# Patient Record
Sex: Female | Born: 1995 | Race: Black or African American | Hispanic: No | Marital: Single | Smoking: Former smoker
Health system: Southern US, Community
[De-identification: ages and names within clinical notes are randomized; demographics above are authoritative.]

## PROBLEM LIST (undated history)

## (undated) DIAGNOSIS — D573 Sickle-cell trait: Secondary | ICD-10-CM

## (undated) DIAGNOSIS — F32A Depression, unspecified: Secondary | ICD-10-CM

## (undated) DIAGNOSIS — B009 Herpesviral infection, unspecified: Secondary | ICD-10-CM

## (undated) DIAGNOSIS — F419 Anxiety disorder, unspecified: Secondary | ICD-10-CM

## (undated) DIAGNOSIS — F99 Mental disorder, not otherwise specified: Secondary | ICD-10-CM

## (undated) DIAGNOSIS — F319 Bipolar disorder, unspecified: Secondary | ICD-10-CM

## (undated) HISTORY — DX: Mental disorder, not otherwise specified: F99

## (undated) HISTORY — DX: Herpesviral infection, unspecified: B00.9

## (undated) HISTORY — PX: CHOLECYSTECTOMY: SHX55

## (undated) HISTORY — PX: TONSILLECTOMY AND ADENOIDECTOMY: SUR1326

---
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
## 2017-07-27 ENCOUNTER — Other Ambulatory Visit (HOSPITAL_COMMUNITY)
Admission: RE | Admit: 2017-07-27 | Discharge: 2017-07-27 | Disposition: A | Discharge status: Home / Self Care | Payer: Self-pay | Source: Ambulatory Visit | Attending: Advanced Practice Midwife | Admitting: Advanced Practice Midwife

## 2017-07-27 ENCOUNTER — Other Ambulatory Visit: Payer: Self-pay

## 2017-07-27 ENCOUNTER — Ambulatory Visit (INDEPENDENT_AMBULATORY_CARE_PROVIDER_SITE_OTHER): Payer: 59 | Admitting: Advanced Practice Midwife

## 2017-07-27 ENCOUNTER — Encounter: Payer: Self-pay | Admitting: Advanced Practice Midwife

## 2017-07-27 ENCOUNTER — Encounter (INDEPENDENT_AMBULATORY_CARE_PROVIDER_SITE_OTHER): Payer: Self-pay

## 2017-07-27 VITALS — BP 107/57 | HR 74 | Ht 61.0 in | Wt 214.0 lb

## 2017-07-27 DIAGNOSIS — Z01419 Encounter for gynecological examination (general) (routine) without abnormal findings: Secondary | ICD-10-CM

## 2017-07-27 DIAGNOSIS — Z01411 Encounter for gynecological examination (general) (routine) with abnormal findings: Secondary | ICD-10-CM | POA: Diagnosis not present

## 2017-07-27 DIAGNOSIS — B009 Herpesviral infection, unspecified: Secondary | ICD-10-CM | POA: Diagnosis not present

## 2017-07-27 HISTORY — DX: Herpesviral infection, unspecified: B00.9

## 2017-07-27 NOTE — Progress Notes (Signed)
Michelle Stephenson 22 y.o.  Vitals:   07/27/17 1112  BP: (!) 107/57  Pulse: 74     Filed Weights   07/27/17 1112  Weight: 214 lb (97.1 kg)    Past Medical History: Past Medical History:  Diagnosis Date  . Mental disorder    depression; PTSD    Past Surgical History: Past Surgical History:  Procedure Laterality Date  . CHOLECYSTECTOMY    . TONSILLECTOMY AND ADENOIDECTOMY      Family History: Family History  Problem Relation Age of Onset  . Diabetes Maternal Grandmother   . Hypertension Maternal Grandmother     Social History: Social History   Tobacco Use  . Smoking status: Current Every Day Smoker    Packs/day: 0.25    Types: Cigarettes  . Smokeless tobacco: Never Used  Substance Use Topics  . Alcohol use: Not Currently  . Drug use: Yes    Types: Marijuana    Allergies:  Allergies  Allergen Reactions  . Banana Swelling  . Onion Swelling  . Strawberry (Diagnostic) Swelling     No current outpatient medications on file.  History of Present Illness: Here for pap  Says had pap 11/18 and had "precancerous cells"  Did not f/u d/t "being terrified".  Discussed reasons/need for f/u and pt agrees to f/u w/todya's results.  Dx w/HSV last year based on clinical appearance.  Has had recurring lesion q mo nth w/period.  Also gets "hair bumps".  Not very happy w/current relationship, [facility], worried she can't get pregnant, but admits she doesn't want to get pregnant w/this man's baby.  Wants to get a NExplanon.  Had informal US by family member, said saw a simple ovarian cyst on left side. No pain.   Review of Systems   Patient denies any headaches, blurred vision, shortness of breath, chest pain, abdominal pain, problems with bowel movements, urination   Physical Exam: General:  Well developed, well nourished, no acute distress Skin:  Warm and dry Neck:  Midline trachea, normal thyroid Lungs; Clear to auscultation bilaterally Breast:  No dominant palpable  mass, retraction, or nipple discharge Cardiovascular: Regular rate and rhythm Abdomen:  Soft, non tender, no hepatosplenomegaly Pelvic:  External genitalia w/drying HSV appearing lesion on left side of mons.  Right vulva has 2 furuncles that dont' appear to be HSV.  None right for culture. The vagina is normal in appearance.  On period (started Sunday). The cervix is nulliparous.  Uterus is felt to be normal size, shape, and contour.  No adnexal masses or tenderness noted.  Extremities:  No swelling or varicosities noted Psych:  No mood changes.     Impression: normal GYN exam HSV Contraception counsleing     Plan: F/U pap per results  Offered valtrex suppression--says had worse outbreaks when taking, so doesn't want to take unless gets new partner as outbreaks don't really bother her  Plan Nexplanon ASAP; on period now, no sex until gets it  FU US in August for ov cyst

## 2017-07-29 ENCOUNTER — Encounter: Payer: 59 | Admitting: Adult Health

## 2017-07-29 LAB — CYTOLOGY - PAP
CHLAMYDIA, DNA PROBE: NEGATIVE
DIAGNOSIS: NEGATIVE
NEISSERIA GONORRHEA: NEGATIVE

## 2017-08-11 ENCOUNTER — Ambulatory Visit (INDEPENDENT_AMBULATORY_CARE_PROVIDER_SITE_OTHER): Payer: 59 | Admitting: Advanced Practice Midwife

## 2017-08-11 ENCOUNTER — Encounter: Payer: Self-pay | Admitting: Advanced Practice Midwife

## 2017-08-11 VITALS — BP 108/73 | HR 88 | Ht 61.0 in | Wt 218.0 lb

## 2017-08-11 DIAGNOSIS — Z3202 Encounter for pregnancy test, result negative: Secondary | ICD-10-CM | POA: Diagnosis not present

## 2017-08-11 DIAGNOSIS — Z3046 Encounter for surveillance of implantable subdermal contraceptive: Secondary | ICD-10-CM | POA: Diagnosis not present

## 2017-08-11 DIAGNOSIS — Z30017 Encounter for initial prescription of implantable subdermal contraceptive: Secondary | ICD-10-CM | POA: Insufficient documentation

## 2017-08-11 LAB — POCT URINE PREGNANCY: Preg Test, Ur: NEGATIVE

## 2017-08-11 MED ORDER — ETONOGESTREL 68 MG ~~LOC~~ IMPL
68.0000 mg | DRUG_IMPLANT | Freq: Once | SUBCUTANEOUS | Status: AC
  Administered 2017-08-11: 68 mg via SUBCUTANEOUS

## 2017-08-11 NOTE — Addendum Note (Signed)
Addended by: Colen DarlingYOUNG, Melven Stockard S on: 08/11/2017 11:53 AM   Modules accepted: Orders

## 2017-08-11 NOTE — Progress Notes (Signed)
  HPI:  Michelle Stephenson is a 22 y.o. year old African American female here for Nexplanon insertion.  Her LMP was 3 weeks ago, no sex since then , and her pregnancy test today was negative.  Risks/benefits/side effects of Nexplanon have been discussed and her questions have been answered.  Specifically, a failure rate of 02/998 has been reported, with an increased failure rate if pt takes St. John's Wort and/or antiseizure medicaitons.  Michelle Stephenson is aware of the common side effect of irregular bleeding, which the incidence of decreases over time.   Past Medical History: Past Medical History:  Diagnosis Date  . 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 Mental disorder    depression; PTSD    Past Surgical History: Past Surgical History:  Procedure Laterality Date  . CHOLECYSTECTOMY    . TONSILLECTOMY AND ADENOIDECTOMY      Family History: Family History  Problem Relation Age of Onset  . Diabetes Maternal Grandmother   . Hypertension Maternal Grandmother     Social History: Social History   Tobacco Use  . Smoking status: Current Every Day Smoker    Packs/day: 0.25    Years: 1.00    Pack years: 0.25    Types: Cigarettes  . Smokeless tobacco: Never Used  Substance Use Topics  . Alcohol use: Not Currently  . Drug use: Yes    Types: Marijuana    Comment: twice a month    Allergies:  Allergies  Allergen Reactions  . Banana Swelling  . Onion Swelling  . Strawberry (Diagnostic) Swelling      Her left arm, approximatly 4 inches proximal from the elbow, was cleansed with alcohol and anesthetized with 2cc of 2% Lidocaine.  The area was cleansed again and the Nexplanon was inserted without difficulty.  A pressure bandage was applied.  Pt was instructed to remove pressure bandage in a few hours, and keep insertion site covered with a bandaid for 3 days.  Back up contraception was recommended for 2 weeks.  Follow-up scheduled PRN problems  Jacklyn ShellFrances  Cresenzo-Dishmon 08/11/2017 11:21 AM

## 2017-11-29 ENCOUNTER — Encounter: Payer: 59 | Admitting: Women's Health

## 2017-12-13 ENCOUNTER — Encounter: Payer: 59 | Admitting: Women's Health

## 2017-12-20 ENCOUNTER — Encounter: Payer: Self-pay | Admitting: Women's Health

## 2017-12-20 ENCOUNTER — Ambulatory Visit (INDEPENDENT_AMBULATORY_CARE_PROVIDER_SITE_OTHER): Payer: 59 | Admitting: Women's Health

## 2017-12-20 VITALS — BP 121/79 | HR 103 | Ht 61.0 in | Wt 225.0 lb

## 2017-12-20 DIAGNOSIS — Z3049 Encounter for surveillance of other contraceptives: Secondary | ICD-10-CM | POA: Diagnosis not present

## 2017-12-20 DIAGNOSIS — Z3046 Encounter for surveillance of implantable subdermal contraceptive: Secondary | ICD-10-CM

## 2017-12-20 NOTE — Patient Instructions (Signed)
Keep the area clean and dry.  You can remove the big bandage in 24 hours, and the small steri-strip bandage in 3-5 days.  
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
## 2017-12-30 ENCOUNTER — Ambulatory Visit: Payer: 59 | Admitting: Women's Health

## 2018-02-09 ENCOUNTER — Ambulatory Visit: Payer: 59 | Admitting: Obstetrics and Gynecology

## 2018-03-23 DIAGNOSIS — Y999 Unspecified external cause status: Secondary | ICD-10-CM | POA: Insufficient documentation

## 2018-03-23 DIAGNOSIS — S6992XA Unspecified injury of left wrist, hand and finger(s), initial encounter: Secondary | ICD-10-CM | POA: Diagnosis present

## 2018-03-23 DIAGNOSIS — F17210 Nicotine dependence, cigarettes, uncomplicated: Secondary | ICD-10-CM | POA: Insufficient documentation

## 2018-03-23 DIAGNOSIS — S01511A Laceration without foreign body of lip, initial encounter: Secondary | ICD-10-CM | POA: Diagnosis not present

## 2018-03-23 DIAGNOSIS — S032XXA Dislocation of tooth, initial encounter: Secondary | ICD-10-CM | POA: Insufficient documentation

## 2018-03-23 DIAGNOSIS — S60222A Contusion of left hand, initial encounter: Secondary | ICD-10-CM | POA: Diagnosis not present

## 2018-03-23 DIAGNOSIS — Y9389 Activity, other specified: Secondary | ICD-10-CM | POA: Insufficient documentation

## 2018-03-23 DIAGNOSIS — Y929 Unspecified place or not applicable: Secondary | ICD-10-CM | POA: Diagnosis not present

## 2018-03-24 ENCOUNTER — Emergency Department (HOSPITAL_COMMUNITY)
Admission: EM | Admit: 2018-03-24 | Discharge: 2018-03-24 | Disposition: A | Discharge status: Home / Self Care | Payer: 59 | Attending: Emergency Medicine | Admitting: Emergency Medicine

## 2018-03-24 ENCOUNTER — Other Ambulatory Visit: Payer: Self-pay

## 2018-03-24 ENCOUNTER — Emergency Department (HOSPITAL_COMMUNITY): Payer: 59

## 2018-03-24 ENCOUNTER — Encounter (HOSPITAL_COMMUNITY): Payer: Self-pay | Admitting: Emergency Medicine

## 2018-03-24 DIAGNOSIS — S60222A Contusion of left hand, initial encounter: Secondary | ICD-10-CM

## 2018-03-24 DIAGNOSIS — S0993XA Unspecified injury of face, initial encounter: Secondary | ICD-10-CM

## 2018-03-24 DIAGNOSIS — S01511A Laceration without foreign body of lip, initial encounter: Secondary | ICD-10-CM

## 2018-03-24 MED ORDER — HYDROCODONE-ACETAMINOPHEN 5-325 MG PO TABS
2.0000 | ORAL_TABLET | Freq: Once | ORAL | Status: AC
  Administered 2018-03-24: 2 via ORAL
  Filled 2018-03-24: qty 2

## 2018-03-24 MED ORDER — HYDROCODONE-ACETAMINOPHEN 5-325 MG PO TABS: 1.0000 | ORAL_TABLET | Freq: Four times a day (QID) | ORAL | 0 refills | Status: DC | PRN

## 2018-03-24 NOTE — Discharge Instructions (Addendum)
Hydrocodone as prescribed as needed for pain.  Saline rinses of your lip every several hours for the next 2 days.  Your hand for 20 minutes every 2 hours while awake for the next 2 days.  Follow-up with dentistry.  The resource guide for local dentist has been provided to you so that you can call and make these arrangements.  I would recommend calling tomorrow morning and arrange an appointment for sometime tomorrow.
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
## 2018-03-24 NOTE — ED Notes (Signed)
Patient transported to X-ray 
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
## 2018-07-27 ENCOUNTER — Other Ambulatory Visit: Payer: Self-pay

## 2018-07-27 ENCOUNTER — Encounter: Payer: Self-pay | Admitting: Adult Health

## 2018-07-27 ENCOUNTER — Ambulatory Visit (INDEPENDENT_AMBULATORY_CARE_PROVIDER_SITE_OTHER): Payer: 59 | Admitting: Adult Health

## 2018-07-27 VITALS — Ht 61.0 in | Wt 210.0 lb

## 2018-07-27 DIAGNOSIS — Z3A01 Less than 8 weeks gestation of pregnancy: Secondary | ICD-10-CM | POA: Insufficient documentation

## 2018-07-27 DIAGNOSIS — Z3201 Encounter for pregnancy test, result positive: Secondary | ICD-10-CM | POA: Insufficient documentation

## 2018-07-27 DIAGNOSIS — O3680X0 Pregnancy with inconclusive fetal viability, not applicable or unspecified: Secondary | ICD-10-CM | POA: Diagnosis not present

## 2018-07-27 NOTE — Progress Notes (Signed)
Patient ID: Michelle Stephenson, female   DOB: Feb 16, 1996, 23 y.o.   MRN: [id number]   TELEHEALTH VIRTUAL GYNECOLOGY VISIT ENCOUNTER NOTE  I connected with Michelle Stephenson on 07/27/18 at  2:45 PM EDT by telephone at home and verified that I am speaking with the correct person using two identifiers.   I discussed the limitations, risks, security and privacy concerns of performing an evaluation and management service by telephone and the availability of in person appointments. I also discussed with the patient that there may be a patient responsible charge related to this service. The patient expressed understanding and agreed to proceed.   History:  Michelle Stephenson is a 23 y.o. G24P0020 female being evaluated today for missed period and +HPT, about 5 weeks by LMP 06/22/18, EDD 03/29/19.She has breat tenderness, some cramping and decrease appetite and constipation. She did detox cleanse about 3 weeks ago. She denies any abnormal vaginal discharge, bleeding,  or other concerns.       Past Medical History:  Diagnosis Date  . 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 Mental disorder    depression; PTSD   Past Surgical History:  Procedure Laterality Date  . CHOLECYSTECTOMY    . TONSILLECTOMY AND ADENOIDECTOMY     The following portions of the patient's history were reviewed and updated as appropriate: allergies, current medications, past family history, past medical history, past social history, past surgical history and problem list.   Health Maintenance:  Normal pap on 07/27/17.  Review of Systems:  Pertinent items noted in HPI and remainder of comprehensive ROS otherwise negative.  Physical Exam:   General:  Alert, oriented and cooperative.   Mental Status: Normal mood and affect perceived. Normal judgment and thought content.  Physical exam deferred due to nature of the encounter Ht 5\' 1"  (1.549 m)   Wt 210 lb (95.3 kg)   LMP 06/22/2018   BMI 39.68 kg/m per pt. Fall risk  is low.  Labs and Imaging No results found for this or any previous visit (from the past 336 hour(s)). No results found.    Assessment and Plan:     1. Positive pregnancy test +HPT  2. Less than [redacted] weeks gestation of pregnancy -continue PNV -eat often -try senokot for constipation -decrease smoking   3. Encounter to determine fetal viability of pregnancy, single or unspecified fetus -will get dating [place] in 3 weeks  - US OB Comp Less 14 Wks; Future       I discussed the assessment and treatment plan with the patient. The patient was provided an opportunity to ask questions and all were answered. The patient agreed with the plan and demonstrated an understanding of the instructions.   The patient was advised to call back or seek an in-person evaluation/go to the ED if the symptoms worsen or if the condition fails to improve as anticipated.  I provided 8 minutes of non-face-to-face time during this encounter.   Cyril Mourning, NP [facility] [employer], [facility]

## 2018-08-04 ENCOUNTER — Telehealth: Payer: Self-pay | Admitting: Women's Health

## 2018-08-04 ENCOUNTER — Other Ambulatory Visit: Payer: 59

## 2018-08-04 NOTE — Telephone Encounter (Signed)
Patient states she noticed some light pink spotting last night that went away but today she has had more.  She is having some cramping as well and has noticed the bleeding is getting brighter in color.  When asked if she has had intercourse recently, pt replied "not really, just a little penetration, just oral". I then asked if she has had anything in her vagina recently and patient stated "just a finger".  Informed patient that some spotting can occur during pregnancy for various reasons one of which is during or after intercourse. Since she was not having very heavy bleeding or passing clots, she could continue to monitor however, since she does not know her blood type, then we could draw labs to check her HCG, progesterone and ABO.  Pt concerned as to why progesterone has not been checked prior to this time and I explained to her that sometimes, that is not always done.  Patient states she was planning on going to the ER.  I told her she could go if she felt like she needed to, however, if she were experiencing a miscarriage, there is not anything that could be done at this time to prevent this from occurring.  Pt verbalized understanding and will come to have labs done.

## 2018-08-04 NOTE — Telephone Encounter (Signed)
Patient called, stated she has an ultrasound appointment on 6/24, but she would like to see someone before then (not change her ultrasound appt), due to the bleeding.  Please advise.  [phone]

## 2018-08-05 LAB — ABO/RH: Rh Factor: POSITIVE

## 2018-08-05 LAB — PROGESTERONE: Progesterone: 0.5 ng/mL

## 2018-08-05 LAB — BETA HCG QUANT (REF LAB): hCG Quant: 77 m[IU]/mL

## 2018-08-08 ENCOUNTER — Other Ambulatory Visit: Payer: Self-pay

## 2018-08-08 ENCOUNTER — Other Ambulatory Visit: Payer: 59

## 2018-08-08 ENCOUNTER — Telehealth: Payer: Self-pay | Admitting: *Deleted

## 2018-08-08 ENCOUNTER — Encounter: Payer: Self-pay | Admitting: *Deleted

## 2018-08-08 DIAGNOSIS — O200 Threatened abortion: Secondary | ICD-10-CM

## 2018-08-08 NOTE — Telephone Encounter (Signed)
mychart message sent requesting patient come in for labwork today.

## 2018-08-09 ENCOUNTER — Other Ambulatory Visit: Payer: Self-pay | Admitting: Adult Health

## 2018-08-09 DIAGNOSIS — O3680X0 Pregnancy with inconclusive fetal viability, not applicable or unspecified: Secondary | ICD-10-CM

## 2018-08-09 DIAGNOSIS — O039 Complete or unspecified spontaneous abortion without complication: Secondary | ICD-10-CM

## 2018-08-09 LAB — BETA HCG QUANT (REF LAB): hCG Quant: 5 m[IU]/mL

## 2018-08-09 NOTE — Progress Notes (Signed)
Check QHCG in 1 week

## 2018-08-16 ENCOUNTER — Encounter: Payer: Self-pay | Admitting: *Deleted

## 2018-08-17 ENCOUNTER — Other Ambulatory Visit: Payer: 59

## 2018-08-18 LAB — BETA HCG QUANT (REF LAB): hCG Quant: <1 m[IU]/mL

## 2018-11-07 ENCOUNTER — Encounter: Payer: Self-pay | Admitting: *Deleted

## 2018-11-08 ENCOUNTER — Ambulatory Visit: Payer: 59 | Admitting: Internal Medicine

## 2019-02-24 NOTE — L&D Delivery Note (Signed)
Delivery Note At 3:56 PM a viable and healthy female was delivered via Vaginal, Spontaneous (Presentation: Left Occiput Anterior).  APGAR: 9, 9; weight  pending.   Placenta status: Spontaneous, Intact.  Cord: 3 vessels with the following complications: None.  Cord pH: na  Anesthesia: Epidural Episiotomy: None Lacerations: 1st degree;Periurethral Suture Repair: 2.0 vicryl rapide Est. Blood Loss (mL): 300  Mom to postpartum.  Baby to Couplet care / Skin to Skin.  Alona Danford J 12/25/2019, 4:10 PM

## 2019-05-22 LAB — OB RESULTS CONSOLE HEPATITIS B SURFACE ANTIGEN: Hepatitis B Surface Ag: NEGATIVE

## 2019-05-22 LAB — OB RESULTS CONSOLE ABO/RH: RH Type: POSITIVE

## 2019-05-22 LAB — OB RESULTS CONSOLE HIV ANTIBODY (ROUTINE TESTING): HIV: NONREACTIVE

## 2019-05-22 LAB — OB RESULTS CONSOLE RUBELLA ANTIBODY, IGM: Rubella: IMMUNE

## 2019-05-22 LAB — OB RESULTS CONSOLE RPR: RPR: NONREACTIVE

## 2019-05-22 LAB — OB RESULTS CONSOLE ANTIBODY SCREEN: Antibody Screen: NEGATIVE

## 2019-06-06 ENCOUNTER — Ambulatory Visit (HOSPITAL_COMMUNITY): Payer: 59 | Attending: Obstetrics and Gynecology

## 2019-06-06 ENCOUNTER — Encounter (HOSPITAL_COMMUNITY): Payer: Self-pay
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
## 2019-11-26 ENCOUNTER — Other Ambulatory Visit: Payer: Self-pay

## 2019-11-26 ENCOUNTER — Inpatient Hospital Stay (HOSPITAL_COMMUNITY)
Admission: AD | Admit: 2019-11-26 | Discharge: 2019-11-27 | Disposition: A | Discharge status: Home / Self Care | Payer: 59 | Attending: Obstetrics & Gynecology | Admitting: Obstetrics & Gynecology

## 2019-11-26 ENCOUNTER — Encounter (HOSPITAL_COMMUNITY): Payer: Self-pay | Admitting: Obstetrics & Gynecology

## 2019-11-26 DIAGNOSIS — D573 Sickle-cell trait: Secondary | ICD-10-CM | POA: Insufficient documentation

## 2019-11-26 DIAGNOSIS — O99333 Smoking (tobacco) complicating pregnancy, third trimester: Secondary | ICD-10-CM | POA: Insufficient documentation

## 2019-11-26 DIAGNOSIS — Z3A36 36 weeks gestation of pregnancy: Secondary | ICD-10-CM

## 2019-11-26 DIAGNOSIS — O4700 False labor before 37 completed weeks of gestation, unspecified trimester: Secondary | ICD-10-CM

## 2019-11-26 DIAGNOSIS — O4703 False labor before 37 completed weeks of gestation, third trimester: Secondary | ICD-10-CM

## 2019-11-26 DIAGNOSIS — F17210 Nicotine dependence, cigarettes, uncomplicated: Secondary | ICD-10-CM | POA: Insufficient documentation

## 2019-11-26 DIAGNOSIS — O99013 Anemia complicating pregnancy, third trimester: Secondary | ICD-10-CM | POA: Insufficient documentation

## 2019-11-26 HISTORY — DX: Sickle-cell trait: D57.3

## 2019-11-26 LAB — URINALYSIS, ROUTINE W REFLEX MICROSCOPIC
Bilirubin Urine: NEGATIVE
Glucose, UA: NEGATIVE mg/dL
Hgb urine dipstick: NEGATIVE
Ketones, ur: NEGATIVE mg/dL
Leukocytes,Ua: NEGATIVE
Nitrite: NEGATIVE
Protein, ur: NEGATIVE mg/dL
Specific Gravity, Urine: 1.009 (ref 1.005–1.030)
pH: 5 (ref 5.0–8.0)

## 2019-11-26 NOTE — MAU Note (Signed)
Pt c/o of constant sharp back pain and intermittent  abdominal pressure for about 1.5 hours. Denies LOF or VB. +FM

## 2019-11-26 NOTE — MAU Provider Note (Signed)
History     CSN: [id number]  Arrival date and time: 11/26/19 2241   First Provider Initiated Contact with Patient 11/26/19 2325      Chief Complaint  Patient presents with   Contractions   Back Pain   HPI Michelle Stephenson is a 24 y.o. G4P0030 at [redacted]w[redacted]d who presents with contractions. She states they started at 2200 and are every 5 minutes. She denies any leaking or bleeding. Reports normal fetal movement. She is unsure what labor feels like and wanted to be checked. She drank 2 bottles of water today total.   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   left mons   Mental disorder    depression; PTSD   Sickle cell trait (HCC)     Past Surgical History:  Procedure Laterality Date   CHOLECYSTECTOMY     TONSILLECTOMY AND ADENOIDECTOMY      Family History  Problem Relation Age of Onset   Diabetes Maternal Grandmother    Hypertension Maternal Grandmother     Social History   Tobacco Use   Smoking status: Current Every Day Smoker    Packs/day: 0.25    Years: 1.00    Pack years: 0.25    Types: Cigarettes   Smokeless tobacco: Never Used  Vaping Use   Vaping Use: Never used  Substance Use Topics   Alcohol use: Not Currently   Drug use: Not Currently    Types: Marijuana    Comment: twice a month    Allergies:  Allergies  Allergen Reactions   Banana Swelling   Onion Swelling   Strawberry (Diagnostic) Swelling    Medications Prior to Admission  Medication Sig Dispense Refill Last Dose   prenatal vitamin w/FE, FA (NATACHEW) 29-1 MG CHEW chewable tablet Chew 1 tablet by mouth daily at 12 noon.   11/25/2019 at Unknown time   HYDROcodone-acetaminophen (NORCO) 5-325 MG tablet Take 1-2 tablets by mouth every 6 (six) hours as  needed. (Patient not taking: Reported on 07/27/2018) 12 tablet 0     Review of Systems  Constitutional: Negative.  Negative for fatigue and fever.  HENT: Negative.   Respiratory: Negative.  Negative for shortness of breath.   Cardiovascular: Negative.  Negative for chest pain.  Gastrointestinal: Positive for abdominal pain. Negative for constipation, diarrhea, nausea and vomiting.  Genitourinary: Negative.  Negative for dysuria, vaginal bleeding and vaginal discharge.  Neurological: Negative.  Negative for dizziness and headaches.   Physical Exam   Blood pressure 110/66, pulse 86, temperature 98 F (36.7 C), temperature source Oral, resp. rate 18, height 5\' 1"  (1.549 m), weight 102.5 kg, SpO2 100 %, unknown if currently breastfeeding.  Physical Exam Vitals and nursing note reviewed.  Constitutional:      General: She is not in acute distress.    Appearance: She is well-developed.  HENT:     Head: Normocephalic.  Eyes:     Pupils: Pupils are equal, round, and reactive to light.  Cardiovascular:     Rate  and Rhythm: Normal rate and regular rhythm.     Heart sounds: Normal heart sounds.  Pulmonary:     Effort: Pulmonary effort is normal. No respiratory distress.     Breath sounds: Normal breath sounds.  Abdominal:     General: Bowel sounds are normal. There is no distension.     Palpations: Abdomen is soft.     Tenderness: There is no abdominal tenderness.  Skin:    General: Skin is warm and dry.  Neurological:     Mental Status: She is alert and oriented to person, place, and time.  Psychiatric:        Behavior: Behavior normal.        Thought Content: Thought content normal.        Judgment: Judgment normal.    Dilation: Closed Effacement (%): Thick Cervical Position: Posterior Exam by:: C.Neil,CNM  Fetal Tracing:  Baseline: 130 Variability: moderate Accels: 15x15 Decels: none  Toco: occasional uc's  MAU Course  Procedures Results for orders placed or  performed during the hospital encounter of 11/26/19 (from the past 24 hour(s))  Urinalysis, Routine w reflex microscopic Urine, Clean Catch     Status: None   Collection Time: 11/26/19 11:39 PM  Result Value Ref Range   Color, Urine YELLOW YELLOW   APPearance CLEAR CLEAR   Specific Gravity, Urine 1.009 1.005 - 1.030   pH 5.0 5.0 - 8.0   Glucose, UA NEGATIVE NEGATIVE mg/dL   Hgb urine dipstick NEGATIVE NEGATIVE   Bilirubin Urine NEGATIVE NEGATIVE   Ketones, ur NEGATIVE NEGATIVE mg/dL   Protein, ur NEGATIVE NEGATIVE mg/dL   Nitrite NEGATIVE NEGATIVE   Leukocytes,Ua NEGATIVE NEGATIVE   MDM UA  Cervix unchanged after 1 hour. Minimal contractions on monitoring. Low suspicion for preterm labor Offered tocolysis for patient comfort and patient declined.  Assessment and Plan   1. Preterm contractions   2. [redacted] weeks gestation of pregnancy    -Discharge home in stable condition -Labor precautions discussed -Patient advised to follow-up with OB as scheduled for prenatal care -Patient may return to MAU as needed or if her condition were to change or worsen   Rolm Bookbinder CNM 11/26/2019, 11:26 PM

## 2019-11-27 DIAGNOSIS — Z3A36 36 weeks gestation of pregnancy: Secondary | ICD-10-CM | POA: Diagnosis not present

## 2019-11-27 DIAGNOSIS — F17210 Nicotine dependence, cigarettes, uncomplicated: Secondary | ICD-10-CM | POA: Diagnosis not present

## 2019-11-27 DIAGNOSIS — O99013 Anemia complicating pregnancy, third trimester: Secondary | ICD-10-CM | POA: Diagnosis not present

## 2019-11-27 DIAGNOSIS — O4703 False labor before 37 completed weeks of gestation, third trimester: Secondary | ICD-10-CM | POA: Diagnosis present

## 2019-11-27 DIAGNOSIS — D573 Sickle-cell trait: Secondary | ICD-10-CM | POA: Diagnosis not present

## 2019-11-27 DIAGNOSIS — O99333 Smoking (tobacco) complicating pregnancy, third trimester: Secondary | ICD-10-CM | POA: Diagnosis not present

## 2019-11-27 NOTE — Discharge Instructions (Signed)

## 2019-12-01 LAB — OB RESULTS CONSOLE GBS: GBS: NEGATIVE

## 2019-12-18 ENCOUNTER — Telehealth (HOSPITAL_COMMUNITY): Payer: Self-pay | Admitting: *Deleted

## 2019-12-18 ENCOUNTER — Encounter (HOSPITAL_COMMUNITY): Payer: Self-pay

## 2019-12-18 ENCOUNTER — Encounter (HOSPITAL_COMMUNITY): Payer: Self-pay | Admitting: *Deleted

## 2019-12-18 NOTE — Telephone Encounter (Signed)
Preadmission screen  

## 2019-12-19 ENCOUNTER — Encounter (HOSPITAL_COMMUNITY): Payer: Self-pay | Admitting: *Deleted

## 2019-12-19 ENCOUNTER — Telehealth (HOSPITAL_COMMUNITY): Payer: Self-pay | Admitting: *Deleted

## 2019-12-19 NOTE — Telephone Encounter (Signed)
Preadmission screen  

## 2019-12-20 ENCOUNTER — Other Ambulatory Visit: Payer: Self-pay | Admitting: Obstetrics and Gynecology

## 2019-12-23 ENCOUNTER — Inpatient Hospital Stay (EMERGENCY_DEPARTMENT_HOSPITAL)
Admission: AD | Admit: 2019-12-23 | Discharge: 2019-12-23 | Disposition: A | Discharge status: Home / Self Care | Payer: 59 | Source: Home / Self Care | Attending: Obstetrics & Gynecology | Admitting: Obstetrics & Gynecology

## 2019-12-23 ENCOUNTER — Encounter (HOSPITAL_COMMUNITY): Payer: Self-pay | Admitting: Obstetrics & Gynecology

## 2019-12-23 ENCOUNTER — Other Ambulatory Visit (HOSPITAL_COMMUNITY): Payer: 59

## 2019-12-23 ENCOUNTER — Other Ambulatory Visit: Payer: Self-pay

## 2019-12-23 DIAGNOSIS — Z3A39 39 weeks gestation of pregnancy: Secondary | ICD-10-CM | POA: Insufficient documentation

## 2019-12-23 DIAGNOSIS — O471 False labor at or after 37 completed weeks of gestation: Secondary | ICD-10-CM | POA: Insufficient documentation

## 2019-12-23 DIAGNOSIS — O479 False labor, unspecified: Secondary | ICD-10-CM

## 2019-12-23 NOTE — Discharge Instructions (Signed)

## 2019-12-23 NOTE — MAU Note (Signed)
Pt understands she I not in labor at this time and is requesting to be discharged to home with labor precautions.

## 2019-12-23 NOTE — MAU Provider Note (Signed)
S: Ms. Michelle Stephenson is a 24 y.o. U1L2440 at [redacted]w[redacted]d  who presents to MAU today complaining contractions q 10-15 minutes since 1200. She denies vaginal bleeding. She denies LOF. She reports normal fetal movement.    O: BP 122/84   Pulse 70   Temp 98.3 F (36.8 C) (Oral)   Resp 16   Ht 5\' 1"  (1.549 m)   Wt 110.3 kg   SpO2 99%   BMI 45.93 kg/m  GENERAL: Well-developed, well-nourished female in no acute distress.  HEAD: Normocephalic, atraumatic.  CHEST: Normal effort of breathing, regular heart rate ABDOMEN: Soft, nontender, gravid  Cervical exam:  Dilation: Fingertip Effacement (%): 40 Cervical Position: Posterior Station: -3 Presentation: Vertex Exam by:: DFM   Fetal Monitoring: Baseline: 140 Variability: moderate Accelerations: 15x15 Decelerations: none Contractions: occasional uc's  Patient given the option to stay and be rechecked in an hour or be discharged home to follow up in the office. Patient desires to be discharged home.   A: SIUP at [redacted]w[redacted]d  False labor  P: -Discharge home in stable condition -Labor precautions discussed -Patient advised to follow-up with OB as scheduled for prenatal care -Patient may return to MAU as needed or if her condition were to change or worsen   [redacted]w[redacted]d, Rolm Bookbinder 12/23/2019 6:34 PM

## 2019-12-23 NOTE — MAU Note (Signed)
Michelle Stephenson is a 24 y.o. at [redacted]w[redacted]d here in MAU reporting: contractions since 0200, they started getting closer around 10. They are now every 30 minutes. No bleeding or LOF. +FM  Onset of complaint: today  Pain score: 7/10  Vitals:   12/23/19 1729  BP: 131/80  Pulse: 84  Resp: 18  Temp: 98.2 F (36.8 C)  SpO2: 99%     FHT: +FM  Lab orders placed from triage: none

## 2019-12-24 ENCOUNTER — Other Ambulatory Visit: Payer: Self-pay

## 2019-12-25 ENCOUNTER — Inpatient Hospital Stay (HOSPITAL_COMMUNITY): Payer: 59 | Admitting: Anesthesiology

## 2019-12-25 ENCOUNTER — Inpatient Hospital Stay (HOSPITAL_COMMUNITY)
Admission: AD | Admit: 2019-12-25 | Discharge: 2019-12-27 | DRG: 806 | Disposition: A | Discharge status: Home / Self Care | Payer: 59 | Attending: Obstetrics and Gynecology | Admitting: Obstetrics and Gynecology

## 2019-12-25 ENCOUNTER — Encounter (HOSPITAL_COMMUNITY): Payer: Self-pay | Admitting: Obstetrics and Gynecology

## 2019-12-25 ENCOUNTER — Other Ambulatory Visit: Payer: Self-pay

## 2019-12-25 ENCOUNTER — Inpatient Hospital Stay (HOSPITAL_COMMUNITY): Payer: 59

## 2019-12-25 DIAGNOSIS — Z3A40 40 weeks gestation of pregnancy: Secondary | ICD-10-CM

## 2019-12-25 DIAGNOSIS — D573 Sickle-cell trait: Secondary | ICD-10-CM | POA: Diagnosis present

## 2019-12-25 DIAGNOSIS — O9832 Other infections with a predominantly sexual mode of transmission complicating childbirth: Secondary | ICD-10-CM | POA: Diagnosis present

## 2019-12-25 DIAGNOSIS — O9902 Anemia complicating childbirth: Secondary | ICD-10-CM | POA: Diagnosis present

## 2019-12-25 DIAGNOSIS — Z87891 Personal history of nicotine dependence: Secondary | ICD-10-CM

## 2019-12-25 DIAGNOSIS — Z20822 Contact with and (suspected) exposure to covid-19: Secondary | ICD-10-CM | POA: Diagnosis present

## 2019-12-25 DIAGNOSIS — A6000 Herpesviral infection of urogenital system, unspecified: Secondary | ICD-10-CM | POA: Diagnosis present

## 2019-12-25 DIAGNOSIS — O480 Post-term pregnancy: Principal | ICD-10-CM | POA: Diagnosis present

## 2019-12-25 DIAGNOSIS — O99214 Obesity complicating childbirth: Secondary | ICD-10-CM | POA: Diagnosis present

## 2019-12-25 DIAGNOSIS — Z3490 Encounter for supervision of normal pregnancy, unspecified, unspecified trimester: Secondary | ICD-10-CM | POA: Diagnosis present

## 2019-12-25 DIAGNOSIS — O26893 Other specified pregnancy related conditions, third trimester: Secondary | ICD-10-CM | POA: Diagnosis present

## 2019-12-25 DIAGNOSIS — B009 Herpesviral infection, unspecified: Secondary | ICD-10-CM | POA: Diagnosis present

## 2019-12-25 LAB — CBC
HCT: 37.6 % (ref 36.0–46.0)
Hemoglobin: 12.6 g/dL (ref 12.0–15.0)
MCH: 27.7 pg (ref 26.0–34.0)
MCHC: 33.5 g/dL (ref 30.0–36.0)
MCV: 82.6 fL (ref 80.0–100.0)
Platelets: 197 10*3/uL (ref 150–400)
RBC: 4.55 MIL/uL (ref 3.87–5.11)
RDW: 15.7 % — ABNORMAL HIGH (ref 11.5–15.5)
WBC: 10.1 10*3/uL (ref 4.0–10.5)
nRBC: 0 % (ref 0.0–0.2)

## 2019-12-25 LAB — RPR: RPR Ser Ql: NONREACTIVE

## 2019-12-25 LAB — TYPE AND SCREEN
ABO/RH(D): A POS
Antibody Screen: NEGATIVE

## 2019-12-25 LAB — RESPIRATORY PANEL BY RT PCR (FLU A&B, COVID)
Influenza A by PCR: NEGATIVE
Influenza B by PCR: NEGATIVE
SARS Coronavirus 2 by RT PCR: NEGATIVE

## 2019-12-25 MED ORDER — EPHEDRINE 5 MG/ML INJ: 10.0000 mg | INTRAVENOUS | Status: DC | PRN

## 2019-12-25 MED ORDER — LIDOCAINE HCL (PF) 1 % IJ SOLN: 30.0000 mL | INTRAMUSCULAR | Status: DC | PRN

## 2019-12-25 MED ORDER — TERBUTALINE SULFATE 1 MG/ML IJ SOLN: 0.2500 mg | Freq: Once | INTRAMUSCULAR | Status: DC | PRN

## 2019-12-25 MED ORDER — ACETAMINOPHEN 325 MG PO TABS: 650.0000 mg | ORAL_TABLET | ORAL | Status: DC | PRN

## 2019-12-25 MED ORDER — PHENYLEPHRINE 40 MCG/ML (10ML) SYRINGE FOR IV PUSH (FOR BLOOD PRESSURE SUPPORT): 80.0000 ug | PREFILLED_SYRINGE | INTRAVENOUS | Status: DC | PRN

## 2019-12-25 MED ORDER — OXYCODONE-ACETAMINOPHEN 5-325 MG PO TABS: 1.0000 | ORAL_TABLET | ORAL | Status: DC | PRN

## 2019-12-25 MED ORDER — TETANUS-DIPHTH-ACELL PERTUSSIS 5-2.5-18.5 LF-MCG/0.5 IM SUSY: 0.5000 mL | PREFILLED_SYRINGE | Freq: Once | INTRAMUSCULAR | Status: DC

## 2019-12-25 MED ORDER — SIMETHICONE 80 MG PO CHEW
80.0000 mg | CHEWABLE_TABLET | ORAL | Status: DC | PRN
  Administered 2019-12-25: 80 mg via ORAL
  Filled 2019-12-25: qty 1

## 2019-12-25 MED ORDER — SOD CITRATE-CITRIC ACID 500-334 MG/5ML PO SOLN: 30.0000 mL | ORAL | Status: DC | PRN

## 2019-12-25 MED ORDER — ZOLPIDEM TARTRATE 5 MG PO TABS: 5.0000 mg | ORAL_TABLET | Freq: Every evening | ORAL | Status: DC | PRN

## 2019-12-25 MED ORDER — LIDOCAINE-EPINEPHRINE (PF) 2 %-1:200000 IJ SOLN
INTRAMUSCULAR | Status: DC | PRN
  Administered 2019-12-25: 5 mL via EPIDURAL

## 2019-12-25 MED ORDER — ONDANSETRON HCL 4 MG PO TABS: 4.0000 mg | ORAL_TABLET | ORAL | Status: DC | PRN

## 2019-12-25 MED ORDER — SODIUM CHLORIDE (PF) 0.9 % IJ SOLN
INTRAMUSCULAR | Status: DC | PRN
  Administered 2019-12-25: 12 mL/h via EPIDURAL

## 2019-12-25 MED ORDER — DIPHENHYDRAMINE HCL 25 MG PO CAPS: 25.0000 mg | ORAL_CAPSULE | Freq: Four times a day (QID) | ORAL | Status: DC | PRN

## 2019-12-25 MED ORDER — BENZOCAINE-MENTHOL 20-0.5 % EX AERO
1.0000 "application " | INHALATION_SPRAY | CUTANEOUS | Status: DC | PRN
  Administered 2019-12-25: 1 via TOPICAL
  Filled 2019-12-25: qty 56

## 2019-12-25 MED ORDER — ONDANSETRON HCL 4 MG/2ML IJ SOLN
4.0000 mg | Freq: Four times a day (QID) | INTRAMUSCULAR | Status: DC | PRN
  Administered 2019-12-25: 4 mg via INTRAVENOUS
  Filled 2019-12-25: qty 2

## 2019-12-25 MED ORDER — OXYCODONE-ACETAMINOPHEN 5-325 MG PO TABS: 2.0000 | ORAL_TABLET | ORAL | Status: DC | PRN

## 2019-12-25 MED ORDER — COCONUT OIL OIL
1.0000 "application " | TOPICAL_OIL | Status: DC | PRN
  Administered 2019-12-26: 1 via TOPICAL

## 2019-12-25 MED ORDER — LACTATED RINGERS IV SOLN: INTRAVENOUS | Status: DC

## 2019-12-25 MED ORDER — SENNOSIDES-DOCUSATE SODIUM 8.6-50 MG PO TABS
2.0000 | ORAL_TABLET | ORAL | Status: DC
  Administered 2019-12-25 – 2019-12-27 (×2): 2 via ORAL
  Filled 2019-12-25 (×2): qty 2

## 2019-12-25 MED ORDER — IBUPROFEN 600 MG PO TABS
600.0000 mg | ORAL_TABLET | Freq: Four times a day (QID) | ORAL | Status: DC
  Administered 2019-12-25 – 2019-12-27 (×6): 600 mg via ORAL
  Filled 2019-12-25 (×6): qty 1

## 2019-12-25 MED ORDER — FENTANYL-BUPIVACAINE-NACL 0.5-0.125-0.9 MG/250ML-% EP SOLN
EPIDURAL | Status: AC
  Filled 2019-12-25: qty 250

## 2019-12-25 MED ORDER — METHYLERGONOVINE MALEATE 0.2 MG PO TABS: 0.2000 mg | ORAL_TABLET | ORAL | Status: DC | PRN

## 2019-12-25 MED ORDER — ONDANSETRON HCL 4 MG/2ML IJ SOLN: 4.0000 mg | INTRAMUSCULAR | Status: DC | PRN

## 2019-12-25 MED ORDER — DIBUCAINE (PERIANAL) 1 % EX OINT: 1.0000 "application " | TOPICAL_OINTMENT | CUTANEOUS | Status: DC | PRN

## 2019-12-25 MED ORDER — OXYTOCIN-SODIUM CHLORIDE 30-0.9 UT/500ML-% IV SOLN
1.0000 m[IU]/min | INTRAVENOUS | Status: DC
  Administered 2019-12-25: 2 m[IU]/min via INTRAVENOUS
  Filled 2019-12-25: qty 500

## 2019-12-25 MED ORDER — METHYLERGONOVINE MALEATE 0.2 MG/ML IJ SOLN: 0.2000 mg | INTRAMUSCULAR | Status: DC | PRN

## 2019-12-25 MED ORDER — PRENATAL MULTIVITAMIN CH
1.0000 | ORAL_TABLET | Freq: Every day | ORAL | Status: DC
  Administered 2019-12-26: 1 via ORAL
  Filled 2019-12-25: qty 1

## 2019-12-25 MED ORDER — OXYTOCIN-SODIUM CHLORIDE 30-0.9 UT/500ML-% IV SOLN: 2.5000 [IU]/h | INTRAVENOUS | Status: DC

## 2019-12-25 MED ORDER — MISOPROSTOL 25 MCG QUARTER TABLET
25.0000 ug | ORAL_TABLET | ORAL | Status: DC | PRN
  Administered 2019-12-25 (×2): 25 ug via VAGINAL
  Filled 2019-12-25 (×2): qty 1

## 2019-12-25 MED ORDER — OXYCODONE-ACETAMINOPHEN 5-325 MG PO TABS
1.0000 | ORAL_TABLET | ORAL | Status: DC | PRN
  Administered 2019-12-27: 1 via ORAL
  Filled 2019-12-25: qty 1

## 2019-12-25 MED ORDER — FENTANYL-BUPIVACAINE-NACL 0.5-0.125-0.9 MG/250ML-% EP SOLN: 12.0000 mL/h | EPIDURAL | Status: DC | PRN

## 2019-12-25 MED ORDER — DIPHENHYDRAMINE HCL 50 MG/ML IJ SOLN: 12.5000 mg | INTRAMUSCULAR | Status: DC | PRN

## 2019-12-25 MED ORDER — WITCH HAZEL-GLYCERIN EX PADS: 1.0000 "application " | MEDICATED_PAD | CUTANEOUS | Status: DC | PRN

## 2019-12-25 MED ORDER — OXYTOCIN BOLUS FROM INFUSION
333.0000 mL | Freq: Once | INTRAVENOUS | Status: AC
  Administered 2019-12-25: 333 mL via INTRAVENOUS

## 2019-12-25 MED ORDER — LACTATED RINGERS IV SOLN: 500.0000 mL | INTRAVENOUS | Status: DC | PRN

## 2019-12-25 MED ORDER — FENTANYL CITRATE (PF) 100 MCG/2ML IJ SOLN
100.0000 ug | INTRAMUSCULAR | Status: DC | PRN
  Administered 2019-12-25 (×2): 100 ug via INTRAVENOUS
  Filled 2019-12-25 (×2): qty 2

## 2019-12-25 MED ORDER — LACTATED RINGERS IV SOLN
500.0000 mL | Freq: Once | INTRAVENOUS | Status: AC
  Administered 2019-12-25: 500 mL via INTRAVENOUS

## 2019-12-25 NOTE — Anesthesia Procedure Notes (Signed)
Epidural Patient location during procedure: OB Start time: 12/25/2019 10:42 AM End time: 12/25/2019 10:52 AM  Staffing Anesthesiologist: Elmer Picker, MD Performed: anesthesiologist   Preanesthetic Checklist Completed: patient identified, IV checked, risks and benefits discussed, monitors and equipment checked, pre-op evaluation and timeout performed  Epidural Patient position: sitting Prep: DuraPrep and site prepped and draped Patient monitoring: continuous pulse ox, blood pressure, heart rate and cardiac monitor Approach: midline Location: L3-L4 Injection technique: LOR air  Needle:  Needle type: Tuohy  Needle gauge: 17 G Needle length: 9 cm Needle insertion depth: 8 cm Catheter type: closed end flexible Catheter size: 19 Gauge Catheter at skin depth: 14 cm Test dose: negative  Assessment Sensory level: T8 Events: blood not aspirated, injection not painful, no injection resistance, no paresthesia and negative IV test  Additional Notes Patient identified. Risks/Benefits/Options discussed with patient including but not limited to bleeding, infection, nerve damage, paralysis, failed block, incomplete pain control, headache, blood pressure changes, nausea, vomiting, reactions to medication both or allergic, itching and postpartum back pain. Confirmed with bedside nurse the patient's most recent platelet count. Confirmed with patient that they are not currently taking any anticoagulation, have any bleeding history or any family history of bleeding disorders. Patient expressed understanding and wished to proceed. All questions were answered. Sterile technique was used throughout the entire procedure. Please see nursing notes for vital signs. Test dose was given through epidural catheter and negative prior to continuing to dose epidural or start infusion. Warning signs of high block given to the patient including shortness of breath, tingling/numbness in hands, complete motor block,  or any concerning symptoms with instructions to call for help. Patient was given instructions on fall risk and not to get out of bed. All questions and concerns addressed with instructions to call with any issues or inadequate analgesia.  Reason for block:procedure for pain

## 2019-12-25 NOTE — Progress Notes (Signed)
Michelle Stephenson is a 24 y.o. G5P0040 at [redacted]w[redacted]d by LMP admitted for induction of labor due to postdates and history of RPL.  Subjective: Feels crampy  Objective: BP 111/70   Pulse (!) 55   Temp 97.8 F (36.6 C) (Oral)   Resp 16   Ht 5\' 1"  (1.549 m)   Wt 110.9 kg   BMI 46.20 kg/m  No intake/output data recorded. No intake/output data recorded.  FHT:  FHR: 145 bpm, variability: moderate,  accelerations:  Present,  decelerations:  Absent UC:   irregular, every 3-5 minutes SVE:   2+/60/-1 AROM clear  Labs: Lab Results  Component Value Date   WBC 10.1 12/25/2019   HGB 12.6 12/25/2019   HCT 37.6 12/25/2019   MCV 82.6 12/25/2019   PLT 197 12/25/2019    Assessment / Plan: IOL for RPL at 40 w  Labor: Progressing normally Preeclampsia:  no signs or symptoms of toxicity Fetal Wellbeing:  Category I Pain Control:  Labor support without medications I/D:  n/a Anticipated MOD:  NSVD  Michelle Stephenson 12/25/2019, 8:18 AM

## 2019-12-25 NOTE — Anesthesia Preprocedure Evaluation (Signed)
Anesthesia Evaluation  Patient identified by MRN, date of birth, ID band Patient awake    Reviewed: Allergy & Precautions, NPO status , Patient's Chart, lab work & pertinent test results  Airway Mallampati: II  TM Distance: >3 FB Neck ROM: Full    Dental no notable dental hx.    Pulmonary neg pulmonary ROS, former smoker,    Pulmonary exam normal breath sounds clear to auscultation       Cardiovascular negative cardio ROS Normal cardiovascular exam Rhythm:Regular Rate:Normal     Neuro/Psych PSYCHIATRIC DISORDERS Depression negative neurological ROS     GI/Hepatic negative GI ROS, Neg liver ROS,   Endo/Other  Morbid obesity (BMI 46)  Renal/GU negative Renal ROS  negative genitourinary   Musculoskeletal negative musculoskeletal ROS (+)   Abdominal   Peds  Hematology negative hematology ROS (+) Sickle cell trait ,   Anesthesia Other Findings   Reproductive/Obstetrics (+) Pregnancy                             Anesthesia Physical Anesthesia Plan  ASA: III  Anesthesia Plan: Epidural   Post-op Pain Management:    Induction:   PONV Risk Score and Plan: Treatment may vary due to age or medical condition  Airway Management Planned: Natural Airway  Additional Equipment:   Intra-op Plan:   Post-operative Plan:   Informed Consent: I have reviewed the patients History and Physical, chart, labs and discussed the procedure including the risks, benefits and alternatives for the proposed anesthesia with the patient or authorized representative who has indicated his/her understanding and acceptance.       Plan Discussed with: Anesthesiologist  Anesthesia Plan Comments: (Patient identified. Risks, benefits, options discussed with patient including but not limited to bleeding, infection, nerve damage, paralysis, failed block, incomplete pain control, headache, blood pressure changes,  nausea, vomiting, reactions to medication, itching, and post partum back pain. Confirmed with bedside nurse the patient's most recent platelet count. Confirmed with the patient that they are not taking any anticoagulation, have any bleeding history or any family history of bleeding disorders. Patient expressed understanding and wishes to proceed. All questions were answered. )        Anesthesia Quick Evaluation

## 2019-12-25 NOTE — H&P (Signed)
Michelle Stephenson is a 24 y.o. female presenting for IOL for history of RPL at 40w. OB History    Gravida  5   Para      Term      Preterm      AB  4   Living        SAB  4   TAB      Ectopic      Multiple      Live Births             Past Medical History:  Diagnosis Date  . 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 Mental disorder    depression; PTSD  . Sickle cell trait [facility])    Past Surgical History:  Procedure Laterality Date  . CHOLECYSTECTOMY    . TONSILLECTOMY AND ADENOIDECTOMY     Family History: family history includes Diabetes in her maternal grandmother; Hypertension in her maternal grandmother. Social History:  reports that she has quit smoking. Her smoking use included cigarettes. She has a 0.25 pack-year smoking history. She has never used smokeless tobacco. She reports previous alcohol use. She reports previous drug use. Drug: Marijuana.     Maternal Diabetes: No Genetic Screening: Normal Maternal Ultrasounds/Referrals: Normal Fetal Ultrasounds or other Referrals:  None Maternal Substance Abuse:  No Significant Maternal Medications:  Meds include: Other: valtrex Significant Maternal Lab Results:  Group B Strep negative Other Comments:  None  Review of Systems  Constitutional: Negative.   All other systems reviewed and are negative.  Maternal Medical History:  Reason for admission: Contractions.   Contractions: Onset was less than 1 hour ago.   Frequency: rare.   Perceived severity is mild.    Fetal activity: Perceived fetal activity is normal.   Last perceived fetal movement was within the past hour.    Prenatal complications: no prenatal complications Prenatal Complications - Diabetes: none.    Dilation: 1 Effacement (%): 50 Station: -3 Exam by:: Michelle Lofton, RN Blood pressure 114/74, pulse 69, temperature 97.8 F (36.6 C), temperature source Oral, resp. rate 17, height 5\' 1"  (1.549 m), weight 110.9 kg,  unknown if currently breastfeeding. Maternal Exam:  Uterine Assessment: Contraction strength is mild.  Contraction frequency is irregular.   Abdomen: Patient reports no abdominal tenderness. Fetal presentation: vertex  Introitus: Normal vulva. Normal vagina.  Ferning test: not done.  Nitrazine test: not done. Amniotic fluid character: not assessed.  Pelvis: questionable for delivery.   Cervix: Cervix evaluated by digital exam.     Physical Exam Vitals and nursing note reviewed.  Constitutional:      Appearance: Normal appearance.  HENT:     Head: Normocephalic and atraumatic.  Cardiovascular:     Rate and Rhythm: Normal rate and regular rhythm.     Pulses: Normal pulses.     Heart sounds: Normal heart sounds.  Pulmonary:     Effort: Pulmonary effort is normal.     Breath sounds: Normal breath sounds.  Abdominal:     Palpations: Abdomen is soft.  Genitourinary:    General: Normal vulva.  Musculoskeletal:        General: Normal range of motion.     Cervical back: Normal range of motion and neck supple.  Skin:    General: Skin is warm and dry.  Neurological:     General: No focal deficit present.     Mental Status: She is alert and oriented to person, place, and time.  Prenatal labs: ABO, Rh: --/--/A POS (11/01 0203) Antibody: NEG (11/01 0203) Rubella: Immune (03/29 0000) RPR: Nonreactive (03/29 0000)  HBsAg: Negative (03/29 0000)  HIV: Non-reactive (03/29 0000)  GBS: Negative/-- (10/08 0000)   Assessment/Plan: 40 wk IUP History of HSV- no lesions on suppression RPL IOL   Michelle Stephenson J 12/25/2019, 6:46 AM

## 2019-12-25 NOTE — Lactation Note (Addendum)
This note was copied from a baby's chart. Lactation Consultation Note  Patient Name: Michelle Stephenson [id number] Date: 12/25/2019 Reason for consult: Initial assessment;1st time breastfeeding;Term P1, 4 hour term female infant. 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 Mom was given harmony hand pump and fitted with 30 mm breast flange. Mom is active on the [facility] Program in [place].  Per mom, she wants assistance with latching infant at breast,she was  having nipple pain earlier with latch. LC observed mom has large breast and nipples, LC suggested mom attempt to latch infant in football hold position with pillow support. LC discussed hand expression and mom easily expressed 11 mls of colostrum from her right breast, infant took 4 mls from spoon and started cuing to BF. Mom latched infant on her right breast, infant latched with depth, swallows observed, infant was still BF after 15 minutes when LC left the room.  Mom understands to BF infant with cues, 8 to 12+ times within 24 hours, STS. LC discussed breast milk storage, collection and that EBM can be left at room temperature for 4 hours. Mom will latch infant at next feeding and give another 5 mls EBM afterwards mom can choose give infant extra volume of EBM if she chooses. Mom knows to call RN or LC if she has any questions, concerns or need further assistance with latching infant at her breast. Mom made aware of O/P services, breastfeeding support groups, community resources, and our phone # for post-discharge questions.  Maternal Data Formula Feeding for Exclusion: No Has patient been taught Hand Expression?: Yes Does the patient have breastfeeding experience prior to this delivery?: No  Feeding Feeding Type: Breast Fed  LATCH Score Latch: Grasps breast easily, tongue down, lips flanged, rhythmical sucking.  Audible Swallowing: Spontaneous and intermittent  Type of  Nipple: Everted at rest and after stimulation  Comfort (Breast/Nipple): Soft / non-tender  Hold (Positioning): Assistance needed to correctly position infant at breast and maintain latch.  LATCH Score: 9  Interventions Interventions: Breast feeding basics reviewed;Assisted with latch;Adjust position;Breast compression;Skin to skin;Support pillows;Hand pump;Position options;Breast massage;Hand express;Expressed milk  Lactation Tools Discussed/Used Tools: Pump;Flanges Flange Size: 30 Breast pump type: Manual WIC Program: Yes Pump Review: Setup, frequency, and cleaning;Milk Storage Initiated by:: Danelle Earthly, IBCLC Date initiated:: 12/25/19   Consult Status Consult Status: Follow-up Date: 12/26/19 Follow-up type: In-patient    Danelle Earthly 12/25/2019, 8:38 PM

## 2019-12-25 NOTE — Progress Notes (Signed)
S: Comfortable with epidural. Family at the bedside providing support.   O: Vitals:   12/25/19 1301 12/25/19 1331 12/25/19 1352 12/25/19 1401  BP: 119/65 121/66  106/63  Pulse: 62 (!) 53  66  Resp:      Temp:   98.4 F (36.9 C)   TempSrc:   Oral   Weight:      Height:       FHT:  FHR: 135 bpm, variability: moderate,  accelerations:  Present,  decelerations:  Present variable decelerations UC:   irregular, every 1.5-4 minutes SVE:   Dilation: 7 Effacement (%): 100 Station: -1 Exam by: A. 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 Billy Stephenson updated on patient status.   Michelle Stephenson, CNM, MSN 12/25/2019, 2:25 PM

## 2019-12-26 LAB — CBC
HCT: 35 % — ABNORMAL LOW (ref 36.0–46.0)
Hemoglobin: 11.8 g/dL — ABNORMAL LOW (ref 12.0–15.0)
MCH: 28.3 pg (ref 26.0–34.0)
MCHC: 33.7 g/dL (ref 30.0–36.0)
MCV: 83.9 fL (ref 80.0–100.0)
Platelets: 167 10*3/uL (ref 150–400)
RBC: 4.17 MIL/uL (ref 3.87–5.11)
RDW: 15.9 % — ABNORMAL HIGH (ref 11.5–15.5)
WBC: 12 10*3/uL — ABNORMAL HIGH (ref 4.0–10.5)
nRBC: 0 % (ref 0.0–0.2)

## 2019-12-26 NOTE — Lactation Note (Addendum)
This note was copied from a baby's chart. Lactation Consultation Note Baby 30 hrs old. Cluster feeding. Noted baby's wt. 5.9 lbs.  Discussed w/mom using DEBP. Mom agrees. Encouraged mom to use DEBP and then hand express giving baby back EBM. Baby has good output. Encouraged mom to occasionally massage breast during feeding. Educated mom on consistency of mature milk, discussed engorgement and milk storage. Mom shown how to use DEBP & how to disassemble, clean, & reassemble parts. Mom knows to pump q3h for 15-20 min. LPI information sheet given. Encouraged mom to give back EBM after BF. Mom isn't understanding why. LC explained to help prevent so much weight loss and help baby gain wt. By not using so much calories getting like desert.   Coconut oil given and applied to nipples and flanges for pumping. Mom stated she was using #30 flange. LC felt they looked big. LC applied #27 mom stated they felt fine. LC is having another LC assess flange size.  Mom stated she is wanting to pump some so FOB can feed baby some. LC brought purple nipples.   LC holding baby while mom pumps d/t fussiness. Baby passing a lot of gas.  Mom used DEBP getting drops of colostrum. Mom trying to hand express but is fussy. Mom calling FOB to come back to room to hold baby so she can get more colostrum. LC reviewed LPI information sheet. Mom isn't interested in giving Donor milk. Mom only wants to give her BM.  Mentioned to mom it is hard to limit feedings to 30 minutes when they want to cluster feed. That is when supplementing w/EBM comes in handy. Praised mom for all her hard work. Encouraged to call for assistance or questions.   Patient Name: Michelle Stephenson [id number] Date: 12/26/2019 Reason for consult: Follow-up assessment;Primapara;Term   Maternal Data Does the patient have breastfeeding experience prior to this delivery?: No  Feeding Feeding Type: Breast Fed  LATCH Score Latch: Grasps breast  easily, tongue down, lips flanged, rhythmical sucking.  Audible Swallowing: A few with stimulation  Type of Nipple: Everted at rest and after stimulation  Comfort (Breast/Nipple): Soft / non-tender  Hold (Positioning): Assistance needed to correctly position infant at breast and maintain latch. (positioning)  LATCH Score: 8  Interventions Interventions: Breast feeding basics reviewed;Position options;Breast massage;Hand express;Hand pump;DEBP;Breast compression;Adjust position;Coconut oil  Lactation Tools Discussed/Used Tools: Pump Breast pump type: Double-Electric Breast Pump Pump Review: Setup, frequency, and cleaning;Milk Storage Initiated by:: Peri Jefferson RN IBCLC Date initiated:: 12/26/19   Consult Status Consult Status: Follow-up Date: 12/27/19 Follow-up type: In-patient    Charyl Dancer 12/26/2019, 10:07 PM

## 2019-12-26 NOTE — Progress Notes (Signed)
PPD # 1 S/P NSVD  Live born female  Birth Weight: 6 lb 0.7 oz (2741 g) APGAR: 9, 9  Newborn Delivery   Birth date/time: 12/25/2019 15:56:00 Delivery type: Vaginal, Spontaneous     Baby name: Michelle Stephenson Delivering provider: Olivia Mackie  Episiotomy:None   Lacerations:1st degree;Periurethral   Feeding: breast  Pain control at delivery: Epidural   S:  Reports feeling well.             Tolerating po/ No nausea or vomiting             Bleeding is light             Pain controlled with ibuprofen (OTC)             Up ad lib / ambulatory / voiding without difficulties   O:  A & O x 3, in no apparent distress              VS:  Vitals:   12/25/19 1931 12/25/19 2311 12/26/19 0352 12/26/19 0750  BP: 113/86 (!) 109/55 107/72 111/82  Pulse: 73 83 78 74  Resp: 18 18 18 17   Temp: 98.7 F (37.1 C) 98.8 F (37.1 C) 98.6 F (37 C) 98.4 F (36.9 C)  TempSrc: Oral Oral Oral Axillary  SpO2: 100% 100% 100% 99%  Weight:      Height:        LABS:  Recent Labs    12/25/19 0203 12/26/19 0623  WBC 10.1 12.0*  HGB 12.6 11.8*  HCT 37.6 35.0*  PLT 197 167    Blood type: --/--/A POS (11/01 0203)  Rubella: Immune (03/29 0000)   I&O: I/O last 3 completed shifts: In: -  Out: 700 [Urine:400; Blood:300]          No intake/output data recorded.  Vaccines: TDaP          Needs prior to DC         Flu             declined                    COVID-19 UTD  Gen: AAO x 3, NAD  Abdomen: soft, non-tender, non-distended             Fundus: firm, non-tender, U-1  Perineum: repair intact  Lochia: small  Extremities: +1 pedal edema, no calf pain or tenderness    A/P: PPD # 1 24 y.o., 07-03-1984   Principal Problem:   Postpartum care following vaginal delivery 11/1 Active Problems:   HSV (herpes simplex virus) infection   First degree perineal laceration   Encounter for induction of labor   SVD (spontaneous vaginal delivery) 11/1   Doing well - stable status  Routine post partum  orders  Anticipate discharge tomorrow    13/1, MSN, CNM 12/26/2019, 9:58 AM

## 2019-12-26 NOTE — Clinical Social Work Maternal (Signed)
CLINICAL SOCIAL WORK MATERNAL/CHILD NOTE  Patient Details  Name: Michelle Stephenson MRN: [id number] Date of Birth: Jun 25, 1995  Date:  12/26/2019  Clinical Social Worker Initiating Note:  Darra Lis, [place] Date/Time: Initiated:  12/26/19/0905     Child's Name:  Alisia Ferrari   Biological Parents:  Mother   Need for Interpreter:  None   Reason for Referral:  Current Substance Use/Substance Use During Pregnancy , Behavioral Health Concerns   Address:  [place] [address]    Phone number:  [phone] (home)     Additional phone number:   Household Members/Support Persons (HM/SP):   Household Member/Support Person 1   HM/SP Name Relationship DOB or Age  HM/SP -[place] Adoptive Mother 05/11/1975  HM/SP -2        HM/SP -3        HM/SP -4        HM/SP -5        HM/SP -6        HM/SP -7        HM/SP -8          Natural Supports (not living in the home):  Immediate Family   Professional Supports: Therapist   Employment: Unemployed   Type of Work:     Education:  [place] arranged:    Museum/gallery curator Resources:      Other Resources:  [facility]   Cultural/Religious Considerations Which May Impact Care:    Strengths:  Ability to meet basic needs , Home prepared for child    Psychotropic Medications:         Pediatrician:       Pediatrician List:   [facility]      Pediatrician Fax Number:    Risk Factors/Current Problems:  Substance Use , Mental Health Concerns    Cognitive State:  Alert , Linear Thinking , Insightful , Goal Oriented    Mood/Affect:  Interested , Happy    CSW Assessment: CSW consulted for [facility] use and history of PTSD/depression. CSW met with MOB to offer support and complete assessment. CSW observed baby in bassinet and FOB bedside. CSW introduced self and role. CSW asked FOB to leave the room to speak to  MOB in privacy, FOB agreed. CSW asked MOB how she is feeling. MOB expressed some frustration with FOB and stated she is otherwise feeling okay. MOB resides with her mother and is currently unemployed. MOB receives [place] and is in the process of applying for food stamps. MOB confirmed a diagnosis of PTSD and depression. MOB disclosed she was first diagnosed in ninth grade at age 23 or 53. MOB stated she did not experience any symptoms of depression during this pregnancy. MOB shared that she experienced a lot of sickness early in the pregnancy. MOB stated she was on medication in [facility], but has not been medication since that time. MOB shared she has been seeing a therapist at [facility] since September. MOB expressed the counseling is helpful. MOB identified her adoptive mother as a support. MOB denies any current SI, HI or being involved in any DV.   MOB confirmed THC use during pregnancy. MOB stated she used about 1g a month of THC. MOB expressed she has always been a smoker and the THC use was calming. MOB stated her last use was  1-2 weeks ago. MOB denies any additional substance or prescription substance use. CSW informed MOB of hospital drug screen policy. CSW informed MOB an UDS and CDS will be performed on baby and if the results are positive for substances, a CPS report will be required. MOB expressed understanding and denied any questions.    CSW provided education regarding the baby blues period vs. perinatal mood disorders, discussed treatment and gave resources for mental health follow up if concerns arise.  CSW recommends self-evaluation during the postpartum time period using the New Mom Checklist from Postpartum Progress and encouraged MOB to contact a medical professional if symptoms are noted at any time.    CSW provided review of Sudden Infant Death Syndrome (SIDS) precautions. MOB stated baby will sleep in a packn'play. MOB expressed she has all of the essential needs for baby,  including a carseat. MOB is still choosing a pediatrician and denies any transportation barriers. MOB declined referrals to additional community resources. MOB discussed being in the process of receiving government housing and was open to additional housing resources, which CSW provided.     CSW will continue to follow UDS/CDS and make a CPS report if warranted. CSW identifies no further need for intervention and no barriers to discharge at this time.  CSW Plan/Description:  No Further Intervention Required/No Barriers to Discharge, Sudden Infant Death Syndrome (SIDS) Education, Perinatal Mood and Anxiety Disorder (PMADs) Education, [place], Child Protective Service Report , CSW Will Continue to Monitor Umbilical Cord Tissue Drug Screen Results and Make Report if [place], [place] 12/26/2019, 10:14 AM

## 2019-12-26 NOTE — Lactation Note (Signed)
This note was copied from a baby's chart. Lactation Consultation Note  Patient Name: Michelle Stephenson [id number] Date: 12/26/2019 Reason for consult: Follow-up assessment;1st time breastfeeding;Term;Infant weight loss;Other (Comment) (baby in the 5S nursery and mom was not in her room - LC made the [facility] Michelle aware)   Maternal Data    Feeding    LATCH Score                   Interventions    Lactation Tools Discussed/Used     Consult Status Consult Status: Follow-up Date: 12/26/19 Follow-up type: In-patient    Michelle Stephenson 12/26/2019, 7:10 PM

## 2019-12-26 NOTE — Anesthesia Postprocedure Evaluation (Signed)
Anesthesia Post Note  Patient: Makaria Poarch Choi  Procedure(s) Performed: AN AD HOC LABOR EPIDURAL     Patient location during evaluation: Mother Baby Anesthesia Type: Epidural Level of consciousness: awake and alert and oriented Pain management: satisfactory to patient Vital Signs Assessment: post-procedure vital signs reviewed and stable Respiratory status: respiratory function stable Cardiovascular status: stable Postop Assessment: no headache, no backache, epidural receding, patient able to bend at knees, no signs of nausea or vomiting, adequate PO intake and able to ambulate Anesthetic complications: no   No complications documented.  Last Vitals:  Vitals:   12/26/19 0352 12/26/19 0750  BP: 107/72 111/82  Pulse: 78 74  Resp: 18 17  Temp: 37 C 36.9 C  SpO2: 100% 99%    Last Pain:  Vitals:   12/26/19 1127  TempSrc:   PainSc: 0-No pain   Pain Goal: Patients Stated Pain Goal: 0 (12/25/19 0140)                 Karleen Dolphin

## 2019-12-27 MED ORDER — ACETAMINOPHEN 500 MG PO TABS: 1000.0000 mg | ORAL_TABLET | Freq: Four times a day (QID) | ORAL | 2 refills | Status: AC | PRN

## 2019-12-27 MED ORDER — BENZOCAINE-MENTHOL 20-0.5 % EX AERO: 1.0000 "application " | INHALATION_SPRAY | CUTANEOUS | Status: DC | PRN

## 2019-12-27 MED ORDER — IBUPROFEN 600 MG PO TABS
600.0000 mg | ORAL_TABLET | Freq: Four times a day (QID) | ORAL | 0 refills | Status: DC
Start: 2019-12-27 — End: 2020-12-28

## 2019-12-27 MED ORDER — COCONUT OIL OIL: 1.0000 "application " | TOPICAL_OIL | 0 refills | Status: DC | PRN

## 2019-12-27 NOTE — Social Work (Signed)
CSW consulted due to MOB and FOB having disagreement and MOB debating having FOB leave the hospital. CSW addressed MOB frustrations with FOB requesting a DNA test during initial assessment. CSW met with MOB to follow-up based on consult.  CSW entered room and observed MOB pumping and FOB sleeping on couch with baby sleeping next to him. CSW asked MOB how she is doing. MOB reported she is doing okay. CSW asked MOB how things are with her and FOB. MOB stated things are about the same as yesterday. CSW informed MOB it was observed that they had some disagreements and asked MOB if she thinks her and FOB will be able to remain cordial during the hospital stay. MOB stated "oh yeah" and stated she is okay with FOB staying with her and newborn.   FOB woke up during conversation. CSW reviewed SIDS educations and the risk of co-sleeping.CSW informed MOB & FOB that newborn should be placed in bassinet if everyone plans to sleep. Parents expressed understanding.   CSW will continue to follow UDS/CDS and make a CPS report if warranted. CSW identifies no further need for intervention and no barriers to discharge at this time.  Michelle Stephenson, LCSWA Clinical Social Work [facility] [phone] 

## 2019-12-27 NOTE — Discharge Summary (Signed)
OB Discharge Summary  Patient Name: Michelle Stephenson DOB: 1995/05/01 MRN: [id number]  Date of admission: 12/25/2019 Delivering provider: Olivia Mackie   Admitting diagnosis: Labor without complication [O80] Intrauterine pregnancy: [redacted]w[redacted]d     Secondary diagnosis: Patient Active Problem List   Diagnosis Date Noted  . First degree perineal laceration 12/25/2019  . Encounter for induction of labor 12/25/2019  . SVD (spontaneous vaginal delivery) 11/1 12/25/2019  . Postpartum care following vaginal delivery 11/1 12/25/2019  . HSV (herpes simplex virus) infection 07/27/2017   Additional problems:none   Date of discharge: 12/27/2019   Discharge diagnosis: Principal Problem:   Postpartum care following vaginal delivery 11/1 Active Problems:   HSV (herpes simplex virus) infection   First degree perineal laceration   Encounter for induction of labor   SVD (spontaneous vaginal delivery) 11/1                                                              Post partum procedures:none  Augmentation: AROM Pain control: Epidural  Laceration:1st degree;Periurethral  Episiotomy:None  Complications: None  Hospital course:  Induction of Labor With Vaginal Delivery   24 y.o. yo [id number] at [redacted]w[redacted]d was admitted to the hospital 12/25/2019 for induction of labor.  Indication for induction: Favorable cervix at term.  Patient had an uncomplicated labor course as follows: Membrane Rupture Time/Date: 8:15 AM ,12/25/2019   Delivery Method:Vaginal, Spontaneous  Episiotomy: None  Lacerations:  1st degree;Periurethral  Details of delivery can be found in separate delivery note.  Patient had a routine postpartum course. Patient is discharged home 12/27/19.  Newborn Data: Birth date:12/25/2019  Birth time:3:56 PM  Gender:Female  Living status:Living  Apgars:9 ,9  Weight:2741 g   Physical exam  Vitals:   12/26/19 0352 12/26/19 0750 12/26/19 1434 12/27/19 0542  BP: 107/72 111/82 111/77 107/60  Pulse: 78 74  61 63  Resp: 18 17 17 18   Temp: 98.6 F (37 C) 98.4 F (36.9 C) 97.8 F (36.6 C) 97.8 F (36.6 C)  TempSrc: Oral Axillary Oral Oral  SpO2: 100% 99% 98% 100%  Weight:      Height:       General: alert, cooperative and no distress Lochia: appropriate Uterine Fundus: firm Incision: N/A Perineum: repair intact, no edema DVT Evaluation: No cords or calf tenderness. No significant calf/ankle edema. Labs: Lab Results  Component Value Date   WBC 12.0 (H) 12/26/2019   HGB 11.8 (L) 12/26/2019   HCT 35.0 (L) 12/26/2019   MCV 83.9 12/26/2019   PLT 167 12/26/2019   No flowsheet data found. Edinburgh Postnatal Depression Scale Screening Tool 12/25/2019  I have been able to laugh and see the funny side of things. (No Data)   Vaccines: TDaP          Prior to discharge         Flu             declined                    COVID-19 UTD  Discharge instruction:  per After Visit Summary,  [facility] OB booklet and  "Understanding Mother & Baby Care" hospital booklet  After Visit Meds:  Allergies as of 12/27/2019      Reactions   Banana Swelling   Latex  Itching   Onion Swelling   Strawberry (diagnostic) Swelling      Medication List    TAKE these medications   acetaminophen 500 MG tablet Commonly known as: TYLENOL Take 2 tablets (1,000 mg total) by mouth every 6 (six) hours as needed.   benzocaine-Menthol 20-0.5 % Aero Commonly known as: DERMOPLAST Apply 1 application topically as needed for irritation (perineal discomfort).   coconut oil Oil Apply 1 application topically as needed.   ferrous sulfate 325 (65 FE) MG tablet Take 325 mg by mouth daily with breakfast.   ibuprofen 600 MG tablet Commonly known as: ADVIL Take 1 tablet (600 mg total) by mouth every 6 (six) hours.   prenatal vitamin w/FE, FA 29-1 MG Chew chewable tablet Chew 1 tablet by mouth daily at 12 noon.            Discharge Care Instructions  (From admission, onward)         Start     Ordered    12/27/19 0000  Discharge wound care:       Comments: Sitz baths 2 times /day with warm water x 1 week. May add herbals: 1 ounce dried comfrey leaf* 1 ounce calendula flowers 1 ounce lavender flowers  Supplies can be found online at [employer] sources at [employer], [employer]  1/2 ounce dried uva ursi leaves 1/2 ounce witch hazel blossoms (if you can find them) 1/2 ounce dried sage leaf 1/2 cup sea salt Directions: Bring 2 quarts of water to a boil. Turn off heat, and place 1 ounce (approximately 1 large handful) of the above mixed herbs (not the salt) into the pot. Steep, covered, for 30 minutes.  Strain the liquid well with a fine mesh strainer, and discard the herb material. Add 2 quarts of liquid to the tub, along with the 1/2 cup of salt. This medicinal liquid can also be made into compresses and peri-rinses.   12/27/19 1148          Diet: routine diet  Activity: Advance as tolerated. Pelvic rest for 6 weeks.   Postpartum contraception: Not Discussed  Newborn Data: Live born female  Birth Weight: 6 lb 0.7 oz (2741 g) APGAR: 9, 9  Newborn Delivery   Birth date/time: 12/25/2019 15:56:00 Delivery type: Vaginal, Spontaneous      named [place] Baby Feeding: Breast Disposition:home with mother   Delivery Report:  Review the Delivery Report for details.    Follow up:  Follow-up Information    Olivia Mackie, MD. Schedule an appointment as soon as possible for a visit in 6 week(s).   Specialty: Obstetrics and Gynecology Contact information: [address] [place] [address] [phone]                 Signed: Cipriano Mile, MSN 12/27/2019, 11:49 AM

## 2019-12-27 NOTE — Lactation Note (Signed)
This note was copied from a baby's chart. Lactation Consultation Note  Patient Name: [name] [id number] Date: 12/27/2019 Reason for consult: Follow-up assessment;Term;Primapara;1st time breastfeeding  P1 mother whose infant is now 36 hours old.  This is a term baby at 40+0 weeks.    Mother has a DEBP in her room, however, has only used it once.  Baby was being examined by the pediatrician when I arrived and mother was finishing a shower.  Per pediatrician's request I offered to assist/observe with latching.  Mother agreeable.  Asked mother to position comfortably in the bed.  Assess baby's suck to be strong and rhythmic on my gloved finger.  Provided appropriate pillow support and assisted baby to latch to the left breast easily.  She began sucking easily and rhythmically.  Demonstrated breast compressions and gentle stimulation.  Baby continued to suck with intermittent stimulation.  Reinforced basic breast feeding concepts while observing mother feeding.  Suggested mother continue to work on positioning herself with good pillow support prior to latching.  Mother verbalized understanding.  Mother will continue to feed 8-12 times/24 hours or sooner if baby shows feeding cues.  Engorgement prevention/treatment reviewed.  Mother has a manual pump at the bedside.  She does not desire a DEBP.  Father present.  RN in room at the end of my visit and updated.   Maternal Data Formula Feeding for Exclusion: No  Feeding Feeding Type: Breast Fed  LATCH Score Latch: Grasps breast easily, tongue down, lips flanged, rhythmical sucking.  Audible Swallowing: A few with stimulation  Type of Nipple: Everted at rest and after stimulation  Comfort (Breast/Nipple): Soft / non-tender  Hold (Positioning): Assistance needed to correctly position infant at breast and maintain latch.  LATCH Score: 8  Interventions Interventions: Breast feeding basics reviewed;Assisted with latch;Skin to skin;Breast  compression;Adjust position;Position options;Support pillows  Lactation Tools Discussed/Used     Consult Status Consult Status: Complete Date: 12/27/19 Follow-up type: Call as needed    Irene Pap Palmer [place] 12/27/2019, 12:18 PM

## 2020-01-05 ENCOUNTER — Encounter (HOSPITAL_COMMUNITY): Payer: Self-pay | Admitting: Obstetrics and Gynecology

## 2020-01-05 ENCOUNTER — Other Ambulatory Visit: Payer: Self-pay

## 2020-01-05 ENCOUNTER — Inpatient Hospital Stay (HOSPITAL_COMMUNITY)
Admission: AD | Admit: 2020-01-05 | Discharge: 2020-01-05 | Disposition: A | Discharge status: Home / Self Care | Payer: 59 | Attending: Obstetrics and Gynecology | Admitting: Obstetrics and Gynecology

## 2020-01-05 DIAGNOSIS — G8918 Other acute postprocedural pain: Secondary | ICD-10-CM | POA: Diagnosis not present

## 2020-01-05 DIAGNOSIS — O9089 Other complications of the puerperium, not elsewhere classified: Secondary | ICD-10-CM | POA: Insufficient documentation

## 2020-01-05 DIAGNOSIS — Z87891 Personal history of nicotine dependence: Secondary | ICD-10-CM | POA: Insufficient documentation

## 2020-01-05 DIAGNOSIS — R102 Pelvic and perineal pain: Secondary | ICD-10-CM

## 2020-01-05 DIAGNOSIS — N939 Abnormal uterine and vaginal bleeding, unspecified: Secondary | ICD-10-CM

## 2020-01-05 DIAGNOSIS — O99893 Other specified diseases and conditions complicating puerperium: Secondary | ICD-10-CM

## 2020-01-05 LAB — CBC
HCT: 39.1 % (ref 36.0–46.0)
Hemoglobin: 12.9 g/dL (ref 12.0–15.0)
MCH: 27.6 pg (ref 26.0–34.0)
MCHC: 33 g/dL (ref 30.0–36.0)
MCV: 83.7 fL (ref 80.0–100.0)
Platelets: 323 10*3/uL (ref 150–400)
RBC: 4.67 MIL/uL (ref 3.87–5.11)
RDW: 15 % (ref 11.5–15.5)
WBC: 8.2 10*3/uL (ref 4.0–10.5)
nRBC: 0 % (ref 0.0–0.2)

## 2020-01-05 NOTE — Progress Notes (Signed)
Michelle Stephenson CNM in earlier to discuss d/c plan. Written and verbal d/c instructions given and understanding voiced. 

## 2020-01-05 NOTE — MAU Provider Note (Signed)
Chief Complaint:  perineal pain and Vaginal Bleeding   First Provider Initiated Contact with Patient 01/05/20 0222       HPI: Michelle Stephenson is a 24 y.o. [id number] who is 10 days postpartum who presents to maternity admissions reporting episode of increased vaginal bleeding and pain in perineal area from sutures.  States feels swollen.  Has gotten some relief from ice packs. . She reports vaginal bleeding, vaginal itching/burning, urinary symptoms, h/a, dizziness, n/v, or fever/chills.    Vaginal Bleeding The patient's primary symptoms include vaginal bleeding. The patient's pertinent negatives include no genital itching, genital lesions, genital odor, genital rash or pelvic pain. This is a recurrent problem. The current episode started in the past 7 days. The problem occurs intermittently. The problem has been unchanged. The pain is moderate. She is not pregnant. Pertinent negatives include no abdominal pain, constipation, diarrhea, dysuria, fever, frequency or nausea. The vaginal discharge was bloody. The vaginal bleeding is heavier than menses. She has not been passing clots. She has not been passing tissue. Nothing aggravates the symptoms. She has tried nothing for the symptoms.   RN Note: SVD 12/25/19 with first degree tear. States fills very swollen down there and bleeding is more than it has been. Used 4-5 pads. The last one was saturated  Past Medical History: Past Medical History:  Diagnosis Date  . 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 Mental disorder    depression; PTSD  . Sickle cell trait (HCC)     Past obstetric history: OB History  Gravida Para Term Preterm AB Living  5 1 1   4 1   SAB TAB Ectopic Multiple Live Births  4     0 1    # Outcome Date GA Lbr Len/2nd Weight Sex Delivery Anes PTL Lv  5 Term 12/25/19 [redacted]w[redacted]d 61:44 / 00:12 2741 g F Vag-Spont EPI  LIV     Birth Comments: moulding/caput  4 SAB 07/2016          3 SAB 02/2016          2 SAB            1 SAB             Past Surgical History: Past Surgical History:  Procedure Laterality Date  . CHOLECYSTECTOMY    . TONSILLECTOMY AND ADENOIDECTOMY      Family History: Family History  Problem Relation Age of Onset  . Diabetes Maternal Grandmother   . Hypertension Maternal Grandmother     Social History: Social History   Tobacco Use  . Smoking status: Former Smoker    Packs/day: 0.25    Years: 1.00    Pack years: 0.25    Types: Cigarettes  . Smokeless tobacco: Never Used  Vaping Use  . Vaping Use: Never used  Substance Use Topics  . Alcohol use: Not Currently  . Drug use: Not Currently    Types: Marijuana    Comment: 12/22/2019 last used marj.    Allergies:  Allergies  Allergen Reactions  . Banana Swelling  . Latex Itching  . Strawberry Extract Swelling  . Onion Swelling  . Strawberry (Diagnostic) Swelling    Meds:  Medications Prior to Admission  Medication Sig Dispense Refill Last Dose  . acetaminophen (TYLENOL) 500 MG tablet Take 2 tablets (1,000 mg total) by mouth every 6 (six) hours as needed. 100 tablet 2 01/04/2020 at Unknown time  . benzocaine-Menthol (DERMOPLAST) 20-0.5 % AERO Apply 1  application topically as needed for irritation (perineal discomfort).   01/04/2020 at Unknown time  . ibuprofen (ADVIL) 600 MG tablet Take 1 tablet (600 mg total) by mouth every 6 (six) hours. 30 tablet 0 01/04/2020 at Unknown time  . prenatal vitamin w/FE, FA (NATACHEW) 29-1 MG CHEW chewable tablet Chew 1 tablet by mouth daily at 12 noon.   01/04/2020 at Unknown time  . coconut oil OIL Apply 1 application topically as needed.  0   . ferrous sulfate 325 (65 FE) MG tablet Take 325 mg by mouth daily with breakfast.       I have reviewed patient's Past Medical Hx, Surgical Hx, Family Hx, Social Hx, medications and allergies.  ROS:  Review of Systems  Constitutional: Negative for fever.  Gastrointestinal: Negative for abdominal pain, constipation, diarrhea and  nausea.  Genitourinary: Positive for vaginal bleeding. Negative for dysuria, frequency and pelvic pain.   Other systems negative     Physical Exam   Patient Vitals for the past 24 hrs:  BP Temp Pulse Resp Height Weight  01/05/20 0205 116/65 -- 73 -- -- --  01/05/20 0204 -- 97.8 F (36.6 C) -- 18 5\' 1"  (1.549 m) 103 kg   Constitutional: Well-developed, well-nourished female in no acute distress.  Cardiovascular: normal rate and rhythm, Respiratory: normal effort, no distress.  GI: Abd soft, non-tender.  Nondistended.  No rebound, No guarding.  MS: Extremities nontender, no edema, normal ROM Neurologic: Alert and oriented x 4.   Grossly nonfocal. GU: Neg CVAT. Skin:  Warm and Dry Psych:  Affect appropriate.  PELVIC EXAM: Perineum healing well. Sutures well approximated. No erethema or noticeable edema.  Vaginal bleeding is small. No flow.    Labs: Results for orders placed or performed during the hospital encounter of 01/05/20 (from the past 24 hour(s))  CBC     Status: None   Collection Time: 01/05/20  2:27 AM  Result Value Ref Range   WBC 8.2 4.0 - 10.5 K/uL   RBC 4.67 3.87 - 5.11 MIL/uL   Hemoglobin 12.9 12.0 - 15.0 g/dL   HCT 13/12/21 36 - 46 %   MCV 83.7 80.0 - 100.0 fL   MCH 27.6 26.0 - 34.0 pg   MCHC 33.0 30.0 - 36.0 g/dL   RDW 38.4 66.5 - 99.3 %   Platelets 323 150 - 400 K/uL   nRBC 0.0 0.0 - 0.2 %    Ref. Range 12/26/2019 06:23  Hemoglobin Latest Ref Range: 12.0 - 15.0 g/dL 13/03/2019 (L)    --/--/A POS (11/01 0203)  Imaging:  No results found.  MAU Course/MDM: I have ordered labs as follows: CBC which shows Hgb higher than she had at discharge Imaging ordered: none Results reviewed. .   Treatments in MAU included none Discussed normal healing of sutures and normal expected lochia .   Pt stable at time of discharge.  Assessment: Perineal pain related to sutures postpartum Normal lochia   Plan: Discharge home Recommend comfort measures for pain Use meds  as prescribed Keep PP appt and notify MD if worsens  Encouraged to return here or to other Urgent Care/ED if she develops worsening of symptoms, increase in pain, fever, or other concerning symptoms.   05-31-2004 CNM, MSN Certified Nurse-Midwife 01/05/2020 2:22 AM

## 2020-01-05 NOTE — Discharge Instructions (Signed)

## 2020-01-05 NOTE — MAU Note (Signed)
SVD 12/25/19 with first degree tear. States fills very swollen down there and bleeding is more than it has been. Used 4-5 pads. The last one was saturated.

## 2020-10-23 LAB — OB RESULTS CONSOLE GC/CHLAMYDIA
Chlamydia: NEGATIVE
Gonorrhea: NEGATIVE

## 2020-12-28 ENCOUNTER — Encounter (HOSPITAL_COMMUNITY): Payer: Self-pay | Admitting: Obstetrics and Gynecology

## 2020-12-28 ENCOUNTER — Inpatient Hospital Stay (HOSPITAL_COMMUNITY)
Admission: AD | Admit: 2020-12-28 | Discharge: 2020-12-28 | Disposition: A | Discharge status: Home / Self Care | Payer: 59 | Attending: Obstetrics and Gynecology | Admitting: Obstetrics and Gynecology

## 2020-12-28 ENCOUNTER — Other Ambulatory Visit: Payer: Self-pay

## 2020-12-28 DIAGNOSIS — Z3A29 29 weeks gestation of pregnancy: Secondary | ICD-10-CM

## 2020-12-28 DIAGNOSIS — O26893 Other specified pregnancy related conditions, third trimester: Secondary | ICD-10-CM | POA: Diagnosis not present

## 2020-12-28 DIAGNOSIS — Z87891 Personal history of nicotine dependence: Secondary | ICD-10-CM | POA: Diagnosis not present

## 2020-12-28 DIAGNOSIS — O479 False labor, unspecified: Secondary | ICD-10-CM

## 2020-12-28 DIAGNOSIS — O4703 False labor before 37 completed weeks of gestation, third trimester: Secondary | ICD-10-CM

## 2020-12-28 DIAGNOSIS — R102 Pelvic and perineal pain: Secondary | ICD-10-CM | POA: Diagnosis present

## 2020-12-28 LAB — URINALYSIS, ROUTINE W REFLEX MICROSCOPIC
Bilirubin Urine: NEGATIVE
Glucose, UA: NEGATIVE mg/dL
Hgb urine dipstick: NEGATIVE
Ketones, ur: NEGATIVE mg/dL
Leukocytes,Ua: NEGATIVE
Nitrite: NEGATIVE
Protein, ur: NEGATIVE mg/dL
Specific Gravity, Urine: 1.013 (ref 1.005–1.030)
pH: 5 (ref 5.0–8.0)

## 2020-12-28 NOTE — MAU Note (Addendum)
.  Michelle Stephenson is a 25 y.o. at Unknown here in MAU reporting: contractions starting around 1900 today with a scant amount of discharge. Pt is also having pelvic and back pain, rating it a 7/10. Pt was receiving prenatal care in [place] and recently moved here.  LMP: 06/03/2020 per pt Onset of complaint: 12/28/2020 Pain score: 7/10 Vitals:   12/28/20 2037  BP: 119/62  Pulse: (!) 106  Resp: 18  Temp: 98.4 F (36.9 C)     FHT: Lab orders placed from triage:  UA

## 2020-12-28 NOTE — MAU Provider Note (Signed)
Chief Complaint:  Contractions and Pelvic Pain   Event Date/Time   First Provider Initiated Contact with Patient 12/28/20 2049      HPI: Michelle Stephenson is a 25 y.o. [id number] at 102w5d by LMP who presents to maternity admissions reporting onset of abdominal cramping while she was at work today. 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 The temperature was 79 degrees today and she became overheated while working and sat down to rest and started contracting. She reports some thin discharge that did not require a pad. She was recently treated for BV but denies any odor or vaginal irritation.  The contractions that started at work were every 5-10 minutes and painful. She has a long walk to her car at work and had to stop a few times before getting to the car because of pain. She reports feeling better now than she did at work. She has not tried any other treatments.  There is good fetal movement.  She started prenatal care in [place] but has moved back to the [place] area and plans to start care at [facility].     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   Mental disorder    depression; PTSD   Sickle cell trait (HCC)     Past obstetric history: OB History  Gravida Para Term Preterm AB Living  6 1 1   4 1   SAB IAB Ectopic Multiple Live Births  4     0 1    # Outcome Date GA Lbr Len/2nd Weight Sex Delivery Anes PTL Lv  6 Current           5 Term 12/25/19 [redacted]w[redacted]d 61:44 / 00:12 2741 g F Vag-Spont EPI  LIV     Birth Comments: moulding/caput  4 SAB 07/2016          3 SAB 02/2016          2 SAB           1 SAB             Past Surgical History: Past  Surgical History:  Procedure Laterality Date   CHOLECYSTECTOMY     TONSILLECTOMY AND ADENOIDECTOMY      Family History: Family History  Problem Relation Age of Onset   Diabetes Maternal Grandmother    Hypertension Maternal Grandmother     Social History: Social History   Tobacco Use   Smoking status: Former    Packs/day: 0.25    Years: 1.00    Pack years: 0.25    Types: Cigarettes   Smokeless tobacco: Never  Vaping Use   Vaping Use: Never used  Substance Use Topics   Alcohol use: Not Currently   Drug use: Not Currently    Types: Marijuana    Comment: 12/22/2019 last used marj.    Allergies:  Allergies  Allergen Reactions   Banana Swelling   Latex Itching   Strawberry Extract Swelling   Onion Swelling   Strawberry (Diagnostic) Swelling    Meds:  Medications Prior to Admission  Medication Sig Dispense Refill Last Dose   benzocaine-Menthol (DERMOPLAST) 20-0.5 % AERO Apply 1 application topically as needed for  irritation (perineal discomfort).   Past Month   coconut oil OIL Apply 1 application topically as needed.  0 Past Month   ibuprofen (ADVIL) 600 MG tablet Take 1 tablet (600 mg total) by mouth every 6 (six) hours. 30 tablet 0 Past Month   prenatal vitamin w/FE, FA (NATACHEW) 29-1 MG CHEW chewable tablet Chew 1 tablet by mouth daily at 12 noon.   12/28/2020    ROS:  Review of Systems  Constitutional:  Negative for chills, fatigue and fever.  Eyes:  Negative for visual disturbance.  Respiratory:  Negative for shortness of breath.   Cardiovascular:  Negative for chest pain.  Gastrointestinal:  Negative for abdominal pain, nausea and vomiting.  Genitourinary:  Negative for difficulty urinating, dysuria, flank pain, pelvic pain, vaginal bleeding, vaginal discharge and vaginal pain.  Neurological:  Negative for dizziness and headaches.  Psychiatric/Behavioral: Negative.      I have reviewed patient's Past Medical Hx, Surgical Hx, Family Hx, Social Hx,  medications and allergies.   Physical Exam  Patient Vitals for the past 24 hrs:  BP Temp Pulse Resp Height Weight  12/28/20 2037 119/62 98.4 F (36.9 C) (!) 106 18 5\' 2"  (1.575 m) 97.1 kg   Constitutional: Well-developed, well-nourished female in no acute distress.  Cardiovascular: normal rate Respiratory: normal effort GI: Abd soft, non-tender, gravid appropriate for gestational age.  MS: Extremities nontender, no edema, normal ROM Neurologic: Alert and oriented x 4.  GU: Neg CVAT.  PELVIC EXAM: Cervix pink, visually closed, without lesion, scant white creamy discharge, vaginal walls and external genitalia normal Bimanual exam: Cervix 0/long/high, firm, anterior, neg CMT, uterus nontender, nonenlarged, adnexa without tenderness, enlargement, or mass  Dilation: Closed Effacement (%): Thick Cervical Position: Posterior Exam by:: [email or web address], CNM  FHT:  Baseline 150, moderate variability, accelerations present, no decelerations Contractions: None on toco or to palpation   Labs: No results found for this or any previous visit (from the past 24 hour(s)).    Imaging:  No results found.  MAU Course/MDM: Orders Placed This Encounter  Procedures   Urinalysis, Routine w reflex microscopic Urine, Clean Catch   Discharge patient    No orders of the defined types were placed in this encounter.    NST reviewed and appropriate for gestational age Cervix closed, no evidence of labor UA pending Rest/ice/heat/warm bath/increase PO fluids/Tylenol/pregnancy support belt [facility] as planned Return to MAU as needed for signs of PTL or emergencies   Assessment: 1. Braxton Hicks contractions   2. [redacted] weeks gestation of pregnancy     Plan: Discharge home Labor precautions and fetal kick counts  Follow-up Information     [facility] Follow up.   Specialty: Obstetrics and Gynecology Why: As scheduled Contact information: [address] [place] [phone]        [facility] Follow up.   Specialty: Obstetrics and Gynecology Why: As needed for emergencies Contact information: [address] [id number] [place] [phone]               Allergies as of 12/28/2020       Reactions   Banana Swelling   Latex Itching   Strawberry Extract Swelling   Onion Swelling   Strawberry (diagnostic) Swelling        Medication List     STOP taking these medications    benzocaine-Menthol 20-0.5 % Aero Commonly known as: DERMOPLAST  ibuprofen 600 MG tablet Commonly known as: ADVIL       TAKE these medications    coconut oil Oil Apply 1 application topically as needed.   prenatal vitamin w/FE, FA 29-1 MG Chew chewable tablet Chew 1 tablet by mouth daily at 12 noon.        Sharen Counter Certified Nurse-Midwife 12/28/2020 9:50 PM

## 2020-12-28 NOTE — Discharge Instructions (Signed)
Reasons to return to MAU at [facility]:  Since you are preterm, return to MAU if:  1.  Contractions are 10 minutes apart or less and they becoming more uncomfortable or painful over time 2.  You have a large gush of fluid, or a trickle of fluid that will not stop and you have to wear a pad 3.  You have bleeding that is bright red, heavier than spotting--like menstrual bleeding (spotting can be normal in early labor or after a check of your cervix) 4.  You do not feel the baby moving like he/she normally does  

## 2021-01-19 ENCOUNTER — Encounter (HOSPITAL_COMMUNITY): Payer: Self-pay | Admitting: Obstetrics and Gynecology

## 2021-01-19 ENCOUNTER — Inpatient Hospital Stay (HOSPITAL_COMMUNITY)
Admission: AD | Admit: 2021-01-19 | Discharge: 2021-01-19 | Disposition: A | Discharge status: Home / Self Care | Payer: 59 | Attending: Obstetrics and Gynecology | Admitting: Obstetrics and Gynecology

## 2021-01-19 ENCOUNTER — Other Ambulatory Visit: Payer: Self-pay

## 2021-01-19 DIAGNOSIS — Z3A32 32 weeks gestation of pregnancy: Secondary | ICD-10-CM | POA: Insufficient documentation

## 2021-01-19 DIAGNOSIS — Z87891 Personal history of nicotine dependence: Secondary | ICD-10-CM | POA: Insufficient documentation

## 2021-01-19 DIAGNOSIS — O479 False labor, unspecified: Secondary | ICD-10-CM

## 2021-01-19 DIAGNOSIS — O4703 False labor before 37 completed weeks of gestation, third trimester: Secondary | ICD-10-CM | POA: Diagnosis not present

## 2021-01-19 LAB — URINALYSIS, ROUTINE W REFLEX MICROSCOPIC
Bilirubin Urine: NEGATIVE
Glucose, UA: NEGATIVE mg/dL
Hgb urine dipstick: NEGATIVE
Ketones, ur: NEGATIVE mg/dL
Nitrite: NEGATIVE
Protein, ur: NEGATIVE mg/dL
Specific Gravity, Urine: 1.014 (ref 1.005–1.030)
pH: 5 (ref 5.0–8.0)

## 2021-01-19 LAB — WET PREP, GENITAL
Clue Cells Wet Prep HPF POC: NONE SEEN
Sperm: NONE SEEN
Trich, Wet Prep: NONE SEEN
WBC, Wet Prep HPF POC: >=10 — AB (ref <10)
Yeast Wet Prep HPF POC: NONE SEEN

## 2021-01-19 NOTE — MAU Provider Note (Signed)
History     CSN: [id number]  Arrival date and time: 01/19/21 1616   Event Date/Time   First Provider Initiated Contact with Patient 01/19/21 1709      Chief Complaint  Patient presents with   Contractions   Vaginal Bleeding   HPI Michelle Stephenson is a 25 y.o. [id number] at [redacted]w[redacted]d who presents with contractions. Symptoms started early this morning. At times felt them every 7 minutes but this afternoon has felt them every 15 minutes. Some are painful & some feel like tightening. Rates pain 6/10 with the painful ones. Hasn't treated symptoms. Also reports an episode of pink mucoid spotting this morning on toilet paper. No bleeding since nor bleeding into a pad. Denies LOF, dysuria, n/v/d/, or abnormal discharge. Reports no sexual activity since the beginning of the pregnancy. Normal fetal movement.  Moved to [place] from [place] last month. Plans on going to [facility] for prenatal care but hasn't made appointment yet.   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   Mental disorder    depression; PTSD   Sickle cell trait (HCC)     Past Surgical History:  Procedure Laterality Date   CHOLECYSTECTOMY     TONSILLECTOMY AND ADENOIDECTOMY      Family History  Problem Relation Age of Onset   Diabetes Maternal Grandmother    Hypertension Maternal Grandmother     Social History   Tobacco Use   Smoking status: Former    Packs/day: 0.25    Years: 1.00    Pack years: 0.25    Types: Cigarettes   Smokeless tobacco: Never  Vaping Use   Vaping Use: Never used  Substance Use Topics   Alcohol use: Not Currently   Drug use: Not Currently    Types: Marijuana    Comment: 12/22/2019 last used marj.    Allergies:  Allergies  Allergen Reactions    Latex Itching    Medications Prior to Admission  Medication Sig Dispense Refill Last Dose   coconut oil OIL Apply 1 application topically as needed.  0    prenatal vitamin w/FE, FA (NATACHEW) 29-1 MG CHEW chewable tablet Chew 1 tablet by mouth daily at 12 noon.       Review of Systems  Constitutional: Negative.   Gastrointestinal:  Positive for abdominal pain. Negative for diarrhea, nausea and vomiting.  Genitourinary:  Positive for vaginal pain. Negative for dysuria, vaginal bleeding and vaginal discharge.  Physical Exam   Blood pressure (!) 98/59, pulse 87, temperature 98.2 F (36.8 C), temperature source Oral, resp. rate 16, last menstrual period 06/03/2020, SpO2 99 %, unknown if currently breastfeeding.  Physical Exam Vitals and nursing note reviewed. Exam conducted with a chaperone present.  Constitutional:      Appearance: Normal appearance. She is not ill-appearing.  HENT:     Head: Normocephalic and atraumatic.  Eyes:     General: No scleral icterus.    Pupils: Pupils are equal, round, and reactive to light.  Pulmonary:  Effort: Pulmonary effort is normal. No respiratory distress.  Abdominal:     Palpations: Abdomen is soft.     Tenderness: There is no abdominal tenderness.  Genitourinary:    General: Normal vulva.     Exam position: Lithotomy position.     Vagina: Erythema and tenderness present. No bleeding.     Cervix: No cervical bleeding.     Comments: Dilation: Closed Effacement (%): Thick Cervical Position: Posterior Station: Ballotable Exam by:: Judeth Horn NP  Skin:    General: Skin is warm and dry.  Neurological:     General: No focal deficit present.     Mental Status: She is alert.  Psychiatric:        Mood and Affect: Mood normal.        Behavior: Behavior normal.   NST:  Baseline: 130 bpm, Variability: Good {> 6 bpm), Accelerations: Reactive, and Decelerations: Absent  MAU Course  Procedures Results for orders placed or performed  during the hospital encounter of 01/19/21 (from the past 24 hour(s))  Urinalysis, Routine w reflex microscopic Urine, Clean Catch     Status: Abnormal   Collection Time: 01/19/21  5:10 PM  Result Value Ref Range   Color, Urine YELLOW YELLOW   APPearance HAZY (A) CLEAR   Specific Gravity, Urine 1.014 1.005 - 1.030   pH 5.0 5.0 - 8.0   Glucose, UA NEGATIVE NEGATIVE mg/dL   Hgb urine dipstick NEGATIVE NEGATIVE   Bilirubin Urine NEGATIVE NEGATIVE   Ketones, ur NEGATIVE NEGATIVE mg/dL   Protein, ur NEGATIVE NEGATIVE mg/dL   Nitrite NEGATIVE NEGATIVE   Leukocytes,Ua SMALL (A) NEGATIVE   RBC / HPF 0-5 0 - 5 RBC/hpf   WBC, UA 0-5 0 - 5 WBC/hpf   Bacteria, UA MANY (A) NONE SEEN   Squamous Epithelial / LPF 11-20 0 - 5   Mucus PRESENT   Wet prep, genital     Status: Abnormal   Collection Time: 01/19/21  5:32 PM  Result Value Ref Range   Yeast Wet Prep HPF POC NONE SEEN NONE SEEN   Trich, Wet Prep NONE SEEN NONE SEEN   Clue Cells Wet Prep HPF POC NONE SEEN NONE SEEN   WBC, Wet Prep HPF POC >=10 (A) <10   Sperm NONE SEEN     MDM Patient presents with contractions.  No regular contractions on TOCO & cervix closed/thick.  Reports episode of spotting this morning. She is RH positive. Per care everywhere, she has a fundal placenta "away from the cervix". Cervix is closed/thick. Wet prep is negative.   U/a with small leuks. No urinary symptoms. Will culture urine.   Assessment and Plan   1. Braxton Hicks contractions   2. [redacted] weeks gestation of pregnancy    -urine culture & GC/CT pending -reviewed PTL precautions -message to [facility] for prenatal care  Judeth Horn 01/19/2021, 6:13 PM

## 2021-01-19 NOTE — MAU Note (Signed)
Pt reports ctx started this morning around 0215, have been coming and going all day, shortest was , longest about 20-30. Pt reports VB around 1515 today, mixed with mucus, enough to fill tissue when wiped. Pt reports another episode of VB, dark spotting w/ mucus around 1600. Pt denies LOF and endorses FM.

## 2021-01-20 LAB — GC/CHLAMYDIA PROBE AMP (~~LOC~~) NOT AT ARMC
Chlamydia: NEGATIVE
Comment: NEGATIVE
Comment: NORMAL
Neisseria Gonorrhea: NEGATIVE

## 2021-01-20 LAB — CULTURE, OB URINE
Culture: NO GROWTH
Special Requests: NORMAL

## 2021-02-03 ENCOUNTER — Encounter: Payer: Self-pay | Admitting: Women's Health

## 2021-02-03 ENCOUNTER — Other Ambulatory Visit: Payer: Self-pay | Admitting: Women's Health

## 2021-02-03 ENCOUNTER — Ambulatory Visit: Payer: 59 | Admitting: *Deleted

## 2021-02-03 ENCOUNTER — Ambulatory Visit (INDEPENDENT_AMBULATORY_CARE_PROVIDER_SITE_OTHER): Payer: 59 | Admitting: Women's Health

## 2021-02-03 ENCOUNTER — Other Ambulatory Visit: Payer: Self-pay

## 2021-02-03 ENCOUNTER — Other Ambulatory Visit: Payer: 59

## 2021-02-03 VITALS — BP 116/74 | HR 120 | Wt 218.0 lb

## 2021-02-03 DIAGNOSIS — Z3A35 35 weeks gestation of pregnancy: Secondary | ICD-10-CM | POA: Diagnosis not present

## 2021-02-03 DIAGNOSIS — Z3490 Encounter for supervision of normal pregnancy, unspecified, unspecified trimester: Secondary | ICD-10-CM | POA: Insufficient documentation

## 2021-02-03 DIAGNOSIS — Z3483 Encounter for supervision of other normal pregnancy, third trimester: Secondary | ICD-10-CM

## 2021-02-03 DIAGNOSIS — Z23 Encounter for immunization: Secondary | ICD-10-CM | POA: Diagnosis not present

## 2021-02-03 DIAGNOSIS — O0932 Supervision of pregnancy with insufficient antenatal care, second trimester: Secondary | ICD-10-CM | POA: Diagnosis not present

## 2021-02-03 DIAGNOSIS — Z3480 Encounter for supervision of other normal pregnancy, unspecified trimester: Secondary | ICD-10-CM

## 2021-02-03 DIAGNOSIS — B009 Herpesviral infection, unspecified: Secondary | ICD-10-CM

## 2021-02-03 MED ORDER — ACYCLOVIR 400 MG PO TABS: 400.0000 mg | ORAL_TABLET | Freq: Three times a day (TID) | ORAL | 3 refills | Status: DC

## 2021-02-03 NOTE — Progress Notes (Signed)
INITIAL OBSTETRICAL VISIT Patient name: Michelle Stephenson MRN [id number]  Date of birth: 11/19/1995 Chief Complaint:   Initial Prenatal Visit  History of Present Illness:   Michelle Stephenson is a 25 y.o. [id number] African-American female at [redacted]w[redacted]d by LMP c/w u/s at 23 weeks with an Estimated Date of Delivery: 03/10/21 being seen today for her initial obstetrical visit with [place].  Reports she had 1 appt w/ [facility] @ 9wks, then moved to [place] to live w/ grandmother, had care there- last visit in Sept. Uncomplicated pregnancy per her report. States she was dx w/ HSV in 2022, however had cultures and bloodwork in [place] and was told it was not herpes, but hair bumps. Wants BTL. Scheduled for PN2 today but ate. Patient's last menstrual period was 06/03/2020. Her obstetrical history is significant for  SAB x 4, term uncomplicated SVB x 1 .   Today she reports no complaints.  Last pap reports it was in Aug in [place].   Depression screen [facility] 2/9 02/03/2021 07/27/2017  Decreased Interest 1 3  Down, Depressed, Hopeless 1 3  PHQ - 2 Score 2 6  Altered sleeping 3 3  Tired, decreased energy 1 0  Change in appetite 1 3  Feeling bad or failure about yourself  2 0  Trouble concentrating 1 0  Moving slowly or fidgety/restless 1 0  Suicidal thoughts 0 0  PHQ-9 Score 11 12  Difficult doing work/chores - Somewhat difficult     GAD 7 : Generalized Anxiety Score 02/03/2021  Nervous, Anxious, on Edge 1  Control/stop worrying 1  Worry too much - different things 3  Trouble relaxing 1  Restless 1  Easily annoyed or irritable 2  Afraid - awful might happen 2  Total GAD 7 Score 11     Review of Systems:   Pertinent items are noted in HPI Denies cramping/contractions, leakage of fluid, vaginal bleeding, abnormal vaginal discharge w/ itching/odor/irritation, headaches, visual changes, shortness of breath, chest pain, abdominal pain, severe nausea/vomiting, or problems with urination or bowel movements unless otherwise  stated above.  Pertinent History Reviewed:  Reviewed past medical,surgical, social, obstetrical and family history.  Reviewed problem list, medications and allergies. OB History  Gravida Para Term Preterm AB Living  6 1 1   4 1   SAB IAB Ectopic Multiple Live Births  4     0 1    # Outcome Date GA Lbr Len/2nd Weight Sex Delivery Anes PTL Lv  6 Current           5 Term 12/25/19 [redacted]w[redacted]d 61:44 / 00:12 6 lb 0.7 oz (2.741 kg) F Vag-Spont EPI N LIV     Birth Comments: moulding/caput  4 SAB 07/2016          3 SAB 02/2016          2 SAB           1 SAB            Physical Assessment:   Vitals:   02/03/21 1138  BP: 116/74  Pulse: (!) 120  Weight: 218 lb (98.9 kg)  Body mass index is 39.87 kg/m.       Physical Examination:  General appearance - well appearing, and in no distress  Mental status - alert, oriented to person, place, and time  Psych:  She has a normal mood and affect  Skin - warm and dry, normal color, no suspicious lesions noted  Chest - effort normal, all lung  fields clear to auscultation bilaterally  Heart - normal rate and regular rhythm  Abdomen - soft, nontender  Extremities:  No swelling or varicosities noted  Thin prep pap is not done   Chaperone: N/A    TODAY'S FH: 35cm FHR: 160 via doppler  No results found for this or any previous visit (from the past 24 hour(s)).  Assessment & Plan:  1) Low-Risk Pregnancy [id number] at [redacted]w[redacted]d with an Estimated Date of Delivery: 03/10/21   2) Initial OB visit with us> moved from [place]  3) ? H/o HSV> dx in 2017, reports she had cultures/bloodwork in [place] and was told it was not herpes but hair bumps-do not see any cultures/serum HSV in our epic or Care Everywhere, will check serum HSV2. Rx acyclovir to start if +  4) Wants BTL>reviewed risks/benefits, discussed high incidence regret <30yo if appropriate, LARCs just as effective, consent signed today   Meds:  Meds ordered this encounter  Medications   acyclovir (ZOVIRAX) 400 MG  tablet    Sig: Take 1 tablet (400 mg total) by mouth 3 (three) times daily.    Dispense:  90 tablet    Refill:  3    Order Specific Question:   Supervising Provider    Answer:   Duane Lope H [[id number]]    Initial labs- will get tomorrow w/ pn2 Continue prenatal vitamins Reviewed n/v relief measures and warning s/s to report Reviewed recommended weight gain based on pre-gravid BMI Encouraged well-balanced diet Ultrasound discussed; fetal survey: results reviewed CCNC completed> form faxed if has or is planning to apply for medicaid The nature of [facility] [employer] with multiple MDs and other Advanced Practice Providers was explained to patient; also emphasized that fellows, residents, and students are part of our team.  Follow-up: Return for tomorrow pn2; weekly CNM LROB in person;, Sign BTL consent today.   Orders Placed This Encounter  Procedures   Tdap vaccine greater than or equal to 7yo IM   CBC/D/Plt+RPR+Rh+ABO+RubIgG...   Pain Management Screening Profile (10S)   HSV 2 antibody, IgG    Cheral Marker CNM, [facility] 02/03/2021 1:00 PM

## 2021-02-03 NOTE — Patient Instructions (Signed)
Vincenza Hews, thank you for choosing our office today! We appreciate the opportunity to meet your healthcare needs. You may receive a short survey by mail, e-mail, or through [employer]. If you are happy with your care we would appreciate if you could take just a few minutes to complete the survey questions. We read all of your comments and take your feedback very seriously. Thank you again for choosing our office.  Center for [employer] Team at [facility]  [facility] at [facility] ([address] [place], [place] [address]) Entrance C, located off of [address] Free 24/7 valet parking   You will have your sugar test next visit.  Please do not eat or drink anything after midnight the night before you come, not even water.  You will be here for at least two hours.  Please make an appointment online for the bloodwork at [email or web address] for 8:30am (or as close to this as possible). Make sure you select the [facility] service center. The day of the appointment, check in with our office first, then you will go to [facility] to start the sugar test.    CLASSES: Go to [email or web address] to register for classes (childbirth, breastfeeding, waterbirth, infant CPR, daddy bootcamp, etc.)  Call the office [phone]) or go to [facility] if: You begin to have strong, frequent contractions Your water breaks.  Sometimes it is a big gush of fluid, sometimes it is just a trickle that keeps getting your panties wet or running down your legs You have vaginal bleeding.  It is normal to have a small amount of spotting if your cervix was checked.  You don't feel your baby moving like normal.  If you don't, get you something to eat and drink and lay down and focus on feeling your baby move.   If your baby is still not moving like normal, you should call the office or go to [facility].  Call the office [phone]) or go to [facility] hospital for these signs of pre-eclampsia: Severe headache that does not  go away with Tylenol Visual changes- seeing spots, double, blurred vision Pain under your right breast or upper abdomen that does not go away with Tums or heartburn medicine Nausea and/or vomiting Severe swelling in your hands, feet, and face   Tdap Vaccine It is recommended that you get the Tdap vaccine during the third trimester of EACH pregnancy to help protect your baby from getting pertussis (whooping cough) 27-36 weeks is the BEST time to do this so that you can pass the protection on to your baby. During pregnancy is better than after pregnancy, but if you are unable to get it during pregnancy it will be offered at the hospital.  You can get this vaccine with [place], at the health department, your family doctor, or some local pharmacies Everyone who will be around your baby should also be up-to-date on their vaccines before the baby comes. Adults (who are not pregnant) only need 1 dose of Tdap during adulthood.   [facility] Pediatricians/Family Doctors [facility]): [address] Colette Ribas, [phone]           [facility]: [address], [phone]                [facility] Medicine [facility]): [address], [phone] (call to ask if accepting patients) [facility] Department: [address], [place], [phone]    [facility] Pediatricians/Family [facility] (  [facility]): [address], [phone] [facility]: [address] [place], [phone] [facility] [place]: [address], [phone]  [facility] Doctors  [facility]): [phone] [facility]: [address] [phone]   [facility] Doctors [facility]: [address] [phone]  [facility] Doctors  [facility]: [phone], [phone]  Home Blood Pressure Monitoring for Patients   Your  provider has recommended that you check your blood pressure (BP) at least once a week at home. If you do not have a blood pressure cuff at home, one will be provided for you. Contact your provider if you have not received your monitor within 1 week.   Helpful Tips for Accurate Home Blood Pressure Checks  Don't smoke, exercise, or drink caffeine 30 minutes before checking your BP Use the restroom before checking your BP (a full bladder can raise your pressure) Relax in a comfortable upright chair Feet on the ground Left arm resting comfortably on a flat surface at the level of your heart Legs uncrossed Back supported Sit quietly and don't talk Place the cuff on your bare arm Adjust snuggly, so that only two fingertips can fit between your skin and the top of the cuff Check 2 readings separated by at least one minute Keep a log of your BP readings For a visual, please reference this diagram: http://ccnc.care/bpdiagram  Provider Name: [facility]     Phone: [phone]  Zone 1: ALL CLEAR  Continue to monitor your symptoms:  BP reading is less than 140 (top number) or less than 90 (bottom number)  No right upper stomach pain No headaches or seeing spots No feeling nauseated or throwing up No swelling in face and hands  Zone 2: CAUTION Call your doctor's office for any of the following:  BP reading is greater than 140 (top number) or greater than 90 (bottom number)  Stomach pain under your ribs in the middle or right side Headaches or seeing spots Feeling nauseated or throwing up Swelling in face and hands  Zone 3: EMERGENCY  Seek immediate medical care if you have any of the following:  BP reading is greater than160 (top number) or greater than 110 (bottom number) Severe headaches not improving with Tylenol Serious difficulty catching your breath Any worsening symptoms from Zone 2   Third Trimester of Pregnancy The third trimester is from week 29 through week 42, months  7 through 9. The third trimester is a time when the fetus is growing rapidly. At the end of the ninth month, the fetus is about 20 inches in length and weighs 6-10 pounds.  BODY CHANGES Your body goes through many changes during pregnancy. The changes vary from woman to woman.  Your weight will continue to increase. You can expect to gain 25-35 pounds (11-16 kg) by the end of the pregnancy. You may begin to get stretch marks on your hips, abdomen, and breasts. You may urinate more often because the fetus is moving lower into your pelvis and pressing on your bladder. You may develop or continue to have heartburn as a result of your pregnancy. You may develop constipation because certain hormones are causing the muscles that push waste through your intestines to slow down. You may develop hemorrhoids or swollen, bulging veins (varicose veins). You may have pelvic pain because of the weight gain and pregnancy hormones relaxing your joints between the bones in  your pelvis. Backaches may result from overexertion of the muscles supporting your posture. You may have changes in your hair. These can include thickening of your hair, rapid growth, and changes in texture. Some women also have hair loss during or after pregnancy, or hair that feels dry or thin. Your hair will most likely return to normal after your baby is born. Your breasts will continue to grow and be tender. A yellow discharge may leak from your breasts called colostrum. Your belly button may stick out. You may feel short of breath because of your expanding uterus. You may notice the fetus "dropping," or moving lower in your abdomen. You may have a bloody mucus discharge. This usually occurs a few days to a week before labor begins. Your cervix becomes thin and soft (effaced) near your due date. WHAT TO EXPECT AT YOUR PRENATAL EXAMS  You will have prenatal exams every 2 weeks until week 36. Then, you will have weekly prenatal exams. During a  routine prenatal visit: You will be weighed to make sure you and the fetus are growing normally. Your blood pressure is taken. Your abdomen will be measured to track your baby's growth. The fetal heartbeat will be listened to. Any test results from the previous visit will be discussed. You may have a cervical check near your due date to see if you have effaced. At around 36 weeks, your caregiver will check your cervix. At the same time, your caregiver will also perform a test on the secretions of the vaginal tissue. This test is to determine if a type of bacteria, Group B streptococcus, is present. Your caregiver will explain this further. Your caregiver may ask you: What your birth plan is. How you are feeling. If you are feeling the baby move. If you have had any abnormal symptoms, such as leaking fluid, bleeding, severe headaches, or abdominal cramping. If you have any questions. Other tests or screenings that may be performed during your third trimester include: Blood tests that check for low iron levels (anemia). Fetal testing to check the health, activity level, and growth of the fetus. Testing is done if you have certain medical conditions or if there are problems during the pregnancy. FALSE LABOR You may feel small, irregular contractions that eventually go away. These are called Braxton Hicks contractions, or false labor. Contractions may last for hours, days, or even weeks before true labor sets in. If contractions come at regular intervals, intensify, or become painful, it is best to be seen by your caregiver.  SIGNS OF LABOR  Menstrual-like cramps. Contractions that are 5 minutes apart or less. Contractions that start on the top of the uterus and spread down to the lower abdomen and back. A sense of increased pelvic pressure or back pain. A watery or bloody mucus discharge that comes from the vagina. If you have any of these signs before the 37th week of pregnancy, call your  caregiver right away. You need to go to the hospital to get checked immediately. HOME CARE INSTRUCTIONS  Avoid all smoking, herbs, alcohol, and unprescribed drugs. These chemicals affect the formation and growth of the baby. Follow your caregiver's instructions regarding medicine use. There are medicines that are either safe or unsafe to take during pregnancy. Exercise only as directed by your caregiver. Experiencing uterine cramps is a good sign to stop exercising. Continue to eat regular, healthy meals. Wear a good support bra for breast tenderness. Do not use hot tubs, steam rooms, or saunas. Wear your  seat belt at all times when driving. Avoid raw meat, uncooked cheese, cat litter boxes, and soil used by cats. These carry germs that can cause birth defects in the baby. Take your prenatal vitamins. Try taking a stool softener (if your caregiver approves) if you develop constipation. Eat more high-fiber foods, such as fresh vegetables or fruit and whole grains. Drink plenty of fluids to keep your urine clear or pale yellow. Take warm sitz baths to soothe any pain or discomfort caused by hemorrhoids. Use hemorrhoid cream if your caregiver approves. If you develop varicose veins, wear support hose. Elevate your feet for 15 minutes, 3-4 times a day. Limit salt in your diet. Avoid heavy lifting, wear low heal shoes, and practice good posture. Rest a lot with your legs elevated if you have leg cramps or low back pain. Visit your dentist if you have not gone during your pregnancy. Use a soft toothbrush to brush your teeth and be gentle when you floss. A sexual relationship may be continued unless your caregiver directs you otherwise. Do not travel far distances unless it is absolutely necessary and only with the approval of your caregiver. Take prenatal classes to understand, practice, and ask questions about the labor and delivery. Make a trial run to the hospital. Pack your hospital bag. Prepare  the baby's nursery. Continue to go to all your prenatal visits as directed by your caregiver. SEEK MEDICAL CARE IF: You are unsure if you are in labor or if your water has broken. You have dizziness. You have mild pelvic cramps, pelvic pressure, or nagging pain in your abdominal area. You have persistent nausea, vomiting, or diarrhea. You have a bad smelling vaginal discharge. You have pain with urination. SEEK IMMEDIATE MEDICAL CARE IF:  You have a fever. You are leaking fluid from your vagina. You have spotting or bleeding from your vagina. You have severe abdominal cramping or pain. You have rapid weight loss or gain. You have shortness of breath with chest pain. You notice sudden or extreme swelling of your face, hands, ankles, feet, or legs. You have not felt your baby move in over an hour. You have severe headaches that do not go away with medicine. You have vision changes. Document Released: 02/03/2001 Document Revised: 02/14/2013 Document Reviewed: 04/12/2012 [facility] Patient Information 2015 [place], [place]. This information is not intended to replace advice given to you by your health care provider. Make sure you discuss any questions you have with your health care provider.

## 2021-02-04 ENCOUNTER — Other Ambulatory Visit: Payer: 59

## 2021-02-04 ENCOUNTER — Encounter: Payer: Self-pay | Admitting: Women's Health

## 2021-02-04 DIAGNOSIS — F1290 Cannabis use, unspecified, uncomplicated: Secondary | ICD-10-CM | POA: Insufficient documentation

## 2021-02-04 LAB — PMP SCREEN PROFILE (10S), URINE
Amphetamine Scrn, Ur: NEGATIVE ng/mL
BARBITURATE SCREEN URINE: NEGATIVE ng/mL
BENZODIAZEPINE SCREEN, URINE: NEGATIVE ng/mL
CANNABINOIDS UR QL SCN: POSITIVE ng/mL — AB
Cocaine (Metab) Scrn, Ur: NEGATIVE ng/mL
Creatinine(Crt), U: 119.3 mg/dL (ref 20.0–300.0)
Methadone Screen, Urine: NEGATIVE ng/mL
OXYCODONE+OXYMORPHONE UR QL SCN: NEGATIVE ng/mL
Opiate Scrn, Ur: NEGATIVE ng/mL
Ph of Urine: 5.4 (ref 4.5–8.9)
Phencyclidine Qn, Ur: NEGATIVE ng/mL
Propoxyphene Scrn, Ur: NEGATIVE ng/mL

## 2021-02-10 ENCOUNTER — Encounter: Payer: Self-pay | Admitting: Women's Health

## 2021-02-10 ENCOUNTER — Other Ambulatory Visit (HOSPITAL_COMMUNITY)
Admission: RE | Admit: 2021-02-10 | Discharge: 2021-02-10 | Disposition: A | Discharge status: Home / Self Care | Payer: 59 | Source: Ambulatory Visit | Attending: Women's Health | Admitting: Women's Health

## 2021-02-10 ENCOUNTER — Other Ambulatory Visit: Payer: Self-pay

## 2021-02-10 ENCOUNTER — Ambulatory Visit (INDEPENDENT_AMBULATORY_CARE_PROVIDER_SITE_OTHER): Payer: 59 | Admitting: Women's Health

## 2021-02-10 VITALS — BP 115/62 | HR 53 | Wt 220.0 lb

## 2021-02-10 DIAGNOSIS — Z3483 Encounter for supervision of other normal pregnancy, third trimester: Secondary | ICD-10-CM

## 2021-02-10 DIAGNOSIS — Z3A36 36 weeks gestation of pregnancy: Secondary | ICD-10-CM

## 2021-02-10 DIAGNOSIS — Z01419 Encounter for gynecological examination (general) (routine) without abnormal findings: Secondary | ICD-10-CM | POA: Diagnosis not present

## 2021-02-10 LAB — OB RESULTS CONSOLE GC/CHLAMYDIA: Gonorrhea: NEGATIVE

## 2021-02-10 NOTE — Patient Instructions (Signed)
Michelle Stephenson, thank you for choosing our office today! We appreciate the opportunity to meet your healthcare needs. You may receive a short survey by mail, e-mail, or through [employer]. If you are happy with your care we would appreciate if you could take just a few minutes to complete the survey questions. We read all of your comments and take your feedback very seriously. Thank you again for choosing our office.  Center for [employer] Team at [facility]  [facility] at [facility] ([address] [place], [place] [address]) Entrance C, located off of [address] Free 24/7 valet parking   CLASSES: Go to [email or web address] to register for classes (childbirth, breastfeeding, waterbirth, infant CPR, daddy bootcamp, etc.)  Call the office [phone]) or go to [facility] if: You begin to have strong, frequent contractions Your water breaks.  Sometimes it is a big gush of fluid, sometimes it is just a trickle that keeps getting your panties wet or running down your legs You have vaginal bleeding.  It is normal to have a small amount of spotting if your cervix was checked.  You don't feel your baby moving like normal.  If you don't, get you something to eat and drink and lay down and focus on feeling your baby move.   If your baby is still not moving like normal, you should call the office or go to [facility].  Call the office [phone]) or go to [facility] hospital for these signs of pre-eclampsia: Severe headache that does not go away with Tylenol Visual changes- seeing spots, double, blurred vision Pain under your right breast or upper abdomen that does not go away with Tums or heartburn medicine Nausea and/or vomiting Severe swelling in your hands, feet, and face   Tdap Vaccine It is recommended that you get the Tdap vaccine during the third trimester of EACH pregnancy to help protect your baby from getting pertussis (whooping cough) 27-36 weeks is the BEST time to do  this so that you can pass the protection on to your baby. During pregnancy is better than after pregnancy, but if you are unable to get it during pregnancy it will be offered at the hospital.  You can get this vaccine with [place], at the health department, your family doctor, or some local pharmacies Everyone who will be around your baby should also be up-to-date on their vaccines before the baby comes. Adults (who are not pregnant) only need 1 dose of Tdap during adulthood.   [facility] Pediatricians/Family Doctors [facility]): [address] Colette Ribas, [phone]           [facility]: [address], [phone]                [facility] Medicine [facility]): [address], [phone] (call to ask if accepting patients) [facility] Department: [address], [place], [phone]    [facility] Pediatricians/Family Doctors [facility]): [phone], [phone] [facility]: [address] [place], [phone] [facility]: [address], [phone]  [facility] Doctors  [facility]): [phone] [facility]: [address], [phone]   [facility] Doctors [facility]: [address], [phone]  [facility] Family Doctors  [facility]: [phone], [phone]  Home Blood Pressure Monitoring for Patients   Your provider has recommended that you check your  blood pressure (BP) at least once a week at home. If you do not have a blood pressure cuff at home, one will be provided for you. Contact your provider if you have not received your monitor within 1 week.  ° °Helpful Tips for Accurate Home Blood Pressure Checks  °Don't smoke, exercise, or drink caffeine 30 minutes before checking your BP °Use the restroom before checking your BP (a full bladder can raise your  pressure) °Relax in a comfortable upright chair °Feet on the ground °Left arm resting comfortably on a flat surface at the level of your heart °Legs uncrossed °Back supported °Sit quietly and don't talk °Place the cuff on your bare arm °Adjust snuggly, so that only two fingertips can fit between your skin and the top of the cuff °Check 2 readings separated by at least one minute °Keep a log of your BP readings °For a visual, please reference this diagram: http://ccnc.care/bpdiagram ° °Provider Name: [facility]     Phone: [phone] ° °Zone 1: ALL CLEAR  °Continue to monitor your symptoms:  °BP reading is less than 140 (top number) or less than 90 (bottom number)  °No right upper stomach pain °No headaches or seeing spots °No feeling nauseated or throwing up °No swelling in face and hands ° °Zone 2: CAUTION °Call your doctor's office for any of the following:  °BP reading is greater than 140 (top number) or greater than 90 (bottom number)  °Stomach pain under your ribs in the middle or right side °Headaches or seeing spots °Feeling nauseated or throwing up °Swelling in face and hands ° °Zone 3: EMERGENCY  °Seek immediate medical care if you have any of the following:  °BP reading is greater than160 (top number) or greater than 110 (bottom number) °Severe headaches not improving with Tylenol °Serious difficulty catching your breath °Any worsening symptoms from Zone 2  °Preterm Labor and Birth Information ° °The normal length of a pregnancy is 39-41 weeks. Preterm labor is when labor starts before 37 completed weeks of pregnancy. °What are the risk factors for preterm labor? °Preterm labor is more likely to occur in women who: °Have certain infections during pregnancy such as a bladder infection, sexually transmitted infection, or infection inside the uterus (chorioamnionitis). °Have a shorter-than-normal cervix. °Have gone into preterm labor before. °Have had surgery on their cervix. °Are younger than age 17  or older than age 35. °Are African American. °Are pregnant with twins or multiple babies (multiple gestation). °Take street drugs or smoke while pregnant. °Do not gain enough weight while pregnant. °Became pregnant shortly after having been pregnant. °What are the symptoms of preterm labor? °Symptoms of preterm labor include: °Cramps similar to those that can happen during a menstrual period. The cramps may happen with diarrhea. °Pain in the abdomen or lower back. °Regular uterine contractions that may feel like tightening of the abdomen. °A feeling of increased pressure in the pelvis. °Increased watery or bloody mucus discharge from the vagina. °Water breaking (ruptured amniotic sac). °Why is it important to recognize signs of preterm labor? °It is important to recognize signs of preterm labor because babies who are born prematurely may not be fully developed. This can put them at an increased risk for: °Long-term (chronic) heart and lung problems. °Difficulty immediately after birth with regulating body systems, including blood sugar, body temperature, heart rate, and breathing rate. °Bleeding in the brain. °Cerebral palsy. °Learning difficulties. °Death. °These risks are highest for babies who are born before 34 weeks   of pregnancy. How is preterm labor treated? Treatment depends on the length of your pregnancy, your condition, and the health of your baby. It may involve: Having a stitch (suture) placed in your cervix to prevent your cervix from opening too early (cerclage). Taking or being given medicines, such as: Hormone medicines. These may be given early in pregnancy to help support the pregnancy. Medicine to stop contractions. Medicines to help mature the babys lungs. These may be prescribed if the risk of delivery is high. Medicines to prevent your baby from developing cerebral palsy. If the labor happens before 34 weeks of pregnancy, you may need to stay in the hospital. What should I do if I  think I am in preterm labor? If you think that you are going into preterm labor, call your health care provider right away. How can I prevent preterm labor in future pregnancies? To increase your chance of having a full-term pregnancy: Do not use any tobacco products, such as cigarettes, chewing tobacco, and e-cigarettes. If you need help quitting, ask your health care provider. Do not use street drugs or medicines that have not been prescribed to you during your pregnancy. Talk with your health care provider before taking any herbal supplements, even if you have been taking them regularly. Make sure you gain a healthy amount of weight during your pregnancy. Watch for infection. If you think that you might have an infection, get it checked right away. Make sure to tell your health care provider if you have gone into preterm labor before. This information is not intended to replace advice given to you by your health care provider. Make sure you discuss any questions you have with your health care provider. Document Revised: 06/03/2018 Document Reviewed: 07/03/2015 Elsevier Patient Education  [place].

## 2021-02-10 NOTE — Progress Notes (Signed)
LOW-RISK PREGNANCY VISIT Patient name: Michelle Stephenson MRN [id number]  Date of birth: 1995/12/20 Chief Complaint:   Routine Prenatal Visit  History of Present Illness:   Michelle Stephenson is a 25 y.o. [id number] female at [redacted]w[redacted]d with an Estimated Date of Delivery: 03/10/21 being seen today for ongoing management of a low-risk pregnancy.   Today she reports  lots of pain, not sure if contractions, hard to sleep. Didn't come for pn2 last week b/c had niece and her daughter. Will try to come this week. . Contractions: Irritability. Vag. Bleeding: None.  Movement: Present. denies leaking of fluid.  Depression screen [facility] 2/9 02/03/2021 07/27/2017  Decreased Interest 1 3  Down, Depressed, Hopeless 1 3  PHQ - 2 Score 2 6  Altered sleeping 3 3  Tired, decreased energy 1 0  Change in appetite 1 3  Feeling bad or failure about yourself  2 0  Trouble concentrating 1 0  Moving slowly or fidgety/restless 1 0  Suicidal thoughts 0 0  PHQ-9 Score 11 12  Difficult doing work/chores - Somewhat difficult     GAD 7 : Generalized Anxiety Score 02/03/2021  Nervous, Anxious, on Edge 1  Control/stop worrying 1  Worry too much - different things 3  Trouble relaxing 1  Restless 1  Easily annoyed or irritable 2  Afraid - awful might happen 2  Total GAD 7 Score 11      Review of Systems:   Pertinent items are noted in HPI Denies abnormal vaginal discharge w/ itching/odor/irritation, headaches, visual changes, shortness of breath, chest pain, abdominal pain, severe nausea/vomiting, or problems with urination or bowel movements unless otherwise stated above. Pertinent History Reviewed:  Reviewed past medical,surgical, social, obstetrical and family history.  Reviewed problem list, medications and allergies. Physical Assessment:   Vitals:   02/10/21 1201  BP: 115/62  Pulse: (!) 53  Weight: 220 lb (99.8 kg)  Body mass index is 40.24 kg/m.        Physical Examination:   General appearance: Well  appearing, and in no distress  Mental status: Alert, oriented to person, place, and time  Skin: Warm & dry  Cardiovascular: Normal heart rate noted  Respiratory: Normal respiratory effort, no distress  Abdomen: Soft, gravid, nontender  Pelvic:  thin prep pap obtained, pt uncomfortable during exam, states that's where her pain has been. Has some yellowish d/c, will see if pap shows anything   Dilation: Closed Effacement (%): Thick Station: Ballotable  Extremities: Edema: Trace  Fetal Status: Fetal Heart Rate (bpm): 145 Fundal Height: 36 cm Movement: Present Presentation: Vertex  Chaperone: Angel Neas   No results found for this or any previous visit (from the past 24 hour(s)).  Assessment & Plan:  1) Low-risk pregnancy [id number] at [redacted]w[redacted]d with an Estimated Date of Delivery: 03/10/21   2) Pain/pressure, cx LTC, reviewed ptl s/s, reasons to seek care  3) Needs labs/pn2> plans to come back this week to do   Meds: No orders of the defined types were placed in this encounter.  Labs/procedures today: pap, GBS, and SVE, gc/ct from pap  Plan:  Continue routine obstetrical care  Next visit: prefers in person    Reviewed: Preterm labor symptoms and general obstetric precautions including but not limited to vaginal bleeding, contractions, leaking of fluid and fetal movement were reviewed in detail with the patient.  All questions were answered. Does have home bp cuff. Office bp cuff given: not applicable. Check bp weekly, let us know if  consistently >140 and/or >90.  Follow-up: Return for ASAP sugar test (no visit), then as scheduled.  Future Appointments  Date Time Provider Department Center  02/13/2021  8:50 AM [facility]  02/19/2021  4:10 PM Arabella Merles, CNM [facility]  02/26/2021  4:10 PM Arabella Merles, CNM [facility]  03/05/2021  4:10 PM Arabella Merles, CNM [facility]    Orders Placed This Encounter  Procedures   Strep Gp B NAA   Cheral Marker CNM, [facility] 02/10/2021 1:23 PM

## 2021-02-11 ENCOUNTER — Other Ambulatory Visit: Payer: 59

## 2021-02-11 ENCOUNTER — Other Ambulatory Visit: Payer: Self-pay | Admitting: Women's Health

## 2021-02-12 LAB — CBC
Hematocrit: 33.8 % — ABNORMAL LOW (ref 34.0–46.6)
Hemoglobin: 11.5 g/dL (ref 11.1–15.9)
MCH: 26.7 pg (ref 26.6–33.0)
MCHC: 34 g/dL (ref 31.5–35.7)
MCV: 78 fL — ABNORMAL LOW (ref 79–97)
Platelets: 185 10*3/uL (ref 150–450)
RBC: 4.31 x10E6/uL (ref 3.77–5.28)
RDW: 13.7 % (ref 11.7–15.4)
WBC: 7 10*3/uL (ref 3.4–10.8)

## 2021-02-12 LAB — CYTOLOGY - PAP
Adequacy: ABSENT
Chlamydia: NEGATIVE
Comment: NEGATIVE
Comment: NEGATIVE
Comment: NORMAL
Diagnosis: NEGATIVE
High risk HPV: NEGATIVE
Neisseria Gonorrhea: NEGATIVE

## 2021-02-12 LAB — GLUCOSE TOLERANCE, 2 HOURS W/ 1HR
Glucose, 1 hour: 158 mg/dL (ref 70–179)
Glucose, 2 hour: 95 mg/dL (ref 70–152)
Glucose, Fasting: 76 mg/dL (ref 70–91)

## 2021-02-12 LAB — ANTIBODY SCREEN: Antibody Screen: NEGATIVE

## 2021-02-12 LAB — RPR: RPR Ser Ql: NONREACTIVE

## 2021-02-12 LAB — STREP GP B NAA: Strep Gp B NAA: NEGATIVE

## 2021-02-12 LAB — HSV 2 ANTIBODY, IGG: HSV 2 IgG, Type Spec: <0.91 index (ref 0.00–0.90)

## 2021-02-12 LAB — HIV ANTIBODY (ROUTINE TESTING W REFLEX): HIV Screen 4th Generation wRfx: NONREACTIVE

## 2021-02-13 ENCOUNTER — Other Ambulatory Visit: Payer: 59

## 2021-02-19 ENCOUNTER — Encounter: Payer: 59 | Admitting: Advanced Practice Midwife

## 2021-02-21 ENCOUNTER — Encounter: Payer: Self-pay | Admitting: Obstetrics & Gynecology

## 2021-02-21 ENCOUNTER — Other Ambulatory Visit: Payer: Self-pay

## 2021-02-21 ENCOUNTER — Ambulatory Visit (INDEPENDENT_AMBULATORY_CARE_PROVIDER_SITE_OTHER): Payer: 59 | Admitting: Obstetrics & Gynecology

## 2021-02-21 ENCOUNTER — Encounter (HOSPITAL_COMMUNITY): Payer: Self-pay | Admitting: Obstetrics & Gynecology

## 2021-02-21 VITALS — BP 118/67 | HR 98 | Wt 220.8 lb

## 2021-02-21 DIAGNOSIS — Z3403 Encounter for supervision of normal first pregnancy, third trimester: Secondary | ICD-10-CM

## 2021-02-21 DIAGNOSIS — O320XX0 Maternal care for unstable lie, not applicable or unspecified: Secondary | ICD-10-CM

## 2021-02-21 NOTE — Progress Notes (Signed)
° °  LOW-RISK PREGNANCY VISIT Patient name: Michelle Stephenson MRN [id number]  Date of birth: 06/14/1995 Chief Complaint:   Routine Prenatal Visit  History of Present Illness:   Michelle Stephenson is a 25 y.o. [id number] female at [redacted]w[redacted]d with an Estimated Date of Delivery: 03/10/21 being seen today for ongoing management of a low-risk pregnancy.   Depression screen [facility] 2/9 02/03/2021 07/27/2017  Decreased Interest 1 3  Down, Depressed, Hopeless 1 3  PHQ - 2 Score 2 6  Altered sleeping 3 3  Tired, decreased energy 1 0  Change in appetite 1 3  Feeling bad or failure about yourself  2 0  Trouble concentrating 1 0  Moving slowly or fidgety/restless 1 0  Suicidal thoughts 0 0  PHQ-9 Score 11 12  Difficult doing work/chores - Somewhat difficult    Today she reports occasional contractions. Contractions: Irregular. Vag. Bleeding: None.  Movement: Present. denies leaking of fluid. Review of Systems:   Pertinent items are noted in HPI Denies abnormal vaginal discharge w/ itching/odor/irritation, headaches, visual changes, shortness of breath, chest pain, abdominal pain, severe nausea/vomiting, or problems with urination or bowel movements unless otherwise stated above. Pertinent History Reviewed:  Reviewed past medical,surgical, social, obstetrical and family history.  Reviewed problem list, medications and allergies.  Physical Assessment:   Vitals:   02/21/21 1108  BP: 118/67  Pulse: 98  Weight: 220 lb 12.8 oz (100.2 kg)  Body mass index is 40.38 kg/m.        Physical Examination:   General appearance: Well appearing, and in no distress  Mental status: Alert, oriented to person, place, and time  Skin: Warm & dry  Respiratory: Normal respiratory effort, no distress  Abdomen: Soft, gravid, nontender  Pelvic: Cervical exam performed  Dilation: Closed Effacement (%): Thick Station: -3  Extremities: Edema: Trace  Psych:  mood and affect appropriate  Fetal Status: Fetal Heart Rate (bpm): 145 Fundal  Height: 37 cm Movement: Present Presentation: Complete Breech  Chaperone:  pt declined     No results found for this or any previous visit (from the past 24 hour(s)).   Assessment & Plan:  1) Low-risk pregnancy [id number] at [redacted]w[redacted]d with an Estimated Date of Delivery: 03/10/21   2) Breech presentation -discussed ECV vs C-section.  Pt would prefer vaginal delivery -ECV scheduled for next available- 1/3 -reviewed labor precautions   Meds: No orders of the defined types were placed in this encounter.  Labs/procedures today: BSUS  Plan:  Continue routine obstetrical care  Next visit: prefers in person    Reviewed: Term labor symptoms and general obstetric precautions including but not limited to vaginal bleeding, contractions, leaking of fluid and fetal movement were reviewed in detail with the patient.  All questions were answered.   Follow-up: Return in about 1 week (around 02/28/2021) for LROB visit Pt to await phone call from hospital regarding ECV No orders of the defined types were placed in this encounter.   Myna Hidalgo, DO Attending Obstetrician & Gynecologist, [facility] for [employer], [facility]

## 2021-02-23 NOTE — L&D Delivery Note (Addendum)
Delivery Note Michelle Stephenson is a 26 y.o. [id number] at [redacted]w[redacted]d admitted for eIOL.   GBS Status: Negative/-- (12/19 1511) Maximum Maternal Temperature: 98.1 F  Labor course: Initial SVE: 2/thick/ballotable. Augmentation with: Cytotec x2. She then progressed to complete.  ROM: 2h 55m with clear fluid  Birth: At 1800 a viable female was delivered via spontaneous vaginal delivery (Presentation: ; MOA ). Nuchal cord present: No. Shoulders and body delivered in usual fashion. Large amount of terminal meconium noted at delivery of the infant. Infant placed directly on mom's abdomen for bonding/skin-to-skin, baby dried and stimulated. Cord clamped x 2 after 1 minute and cut by RN.  Cord blood collected.  The placenta separated spontaneously and delivered via gentle cord traction.  Pitocin infused rapidly IV per protocol.  Fundus firm with massage. The perineum, vagina and cervix were carefully assessed and the patient was noted to have no lacerations. Placenta inspected and appears to be intact with a 3 VC.  Placenta/Cord with the following complications: none .  Sponge and instrument count were correct x2.  Intrapartum complications:  None Anesthesia:  epidural Episiotomy: none Lacerations:  none Suture Repair:  n/a EBL (mL): 100   Infant: APGAR (1 MIN):  8 APGAR (5 MINS):  9 APGAR (10 MINS):    Infant weight: pending  Mom to postpartum.  Baby to Couplet care / Skin to Skin. Placenta to L&D   Plans to Breastfeed Contraception: tubal ligation Circumcision: N/A  Note sent to [facility]: FT for pp visit.  Raylene Everts, MD 03/04/2021 6:17 PM

## 2021-02-24 ENCOUNTER — Encounter (HOSPITAL_COMMUNITY): Payer: Self-pay | Admitting: *Deleted

## 2021-02-24 ENCOUNTER — Other Ambulatory Visit (HOSPITAL_COMMUNITY): Payer: Self-pay | Admitting: General Practice

## 2021-02-24 ENCOUNTER — Encounter (HOSPITAL_COMMUNITY): Admission: RE | Admit: 2021-02-24 | Payer: 59 | Source: Ambulatory Visit

## 2021-02-24 ENCOUNTER — Telehealth (HOSPITAL_COMMUNITY): Payer: Self-pay | Admitting: *Deleted

## 2021-02-24 NOTE — Telephone Encounter (Signed)
Preadmission screen  

## 2021-02-25 ENCOUNTER — Encounter (HOSPITAL_COMMUNITY): Payer: Self-pay | Admitting: Obstetrics & Gynecology

## 2021-02-25 ENCOUNTER — Inpatient Hospital Stay (HOSPITAL_COMMUNITY): Payer: 59

## 2021-02-25 ENCOUNTER — Encounter (HOSPITAL_COMMUNITY): Payer: Self-pay | Admitting: Anesthesiology

## 2021-02-25 ENCOUNTER — Other Ambulatory Visit: Payer: Self-pay

## 2021-02-25 ENCOUNTER — Ambulatory Visit (HOSPITAL_COMMUNITY)
Admission: AD | Admit: 2021-02-25 | Discharge: 2021-02-25 | Disposition: A | Discharge status: Home / Self Care | Payer: 59 | Source: Ambulatory Visit | Attending: Obstetrics & Gynecology | Admitting: Obstetrics & Gynecology

## 2021-02-25 DIAGNOSIS — Z3480 Encounter for supervision of other normal pregnancy, unspecified trimester: Secondary | ICD-10-CM

## 2021-02-25 DIAGNOSIS — O320XX0 Maternal care for unstable lie, not applicable or unspecified: Secondary | ICD-10-CM | POA: Diagnosis present

## 2021-02-25 DIAGNOSIS — Z3A38 38 weeks gestation of pregnancy: Secondary | ICD-10-CM | POA: Diagnosis not present

## 2021-02-25 DIAGNOSIS — O0932 Supervision of pregnancy with insufficient antenatal care, second trimester: Secondary | ICD-10-CM

## 2021-02-25 HISTORY — DX: Bipolar disorder, unspecified: F31.9

## 2021-02-25 HISTORY — DX: Depression, unspecified: F32.A

## 2021-02-25 HISTORY — DX: Anxiety disorder, unspecified: F41.9

## 2021-02-25 LAB — CBC
HCT: 36 % (ref 36.0–46.0)
Hemoglobin: 12 g/dL (ref 12.0–15.0)
MCH: 26 pg (ref 26.0–34.0)
MCHC: 33.3 g/dL (ref 30.0–36.0)
MCV: 77.9 fL — ABNORMAL LOW (ref 80.0–100.0)
Platelets: 198 10*3/uL (ref 150–400)
RBC: 4.62 MIL/uL (ref 3.87–5.11)
RDW: 14.3 % (ref 11.5–15.5)
WBC: 8.6 10*3/uL (ref 4.0–10.5)
nRBC: 0 % (ref 0.0–0.2)

## 2021-02-25 LAB — TYPE AND SCREEN
ABO/RH(D): A POS
Antibody Screen: NEGATIVE

## 2021-02-25 LAB — HEPATITIS B SURFACE ANTIGEN: Hepatitis B Surface Ag: NONREACTIVE

## 2021-02-25 LAB — OB RESULTS CONSOLE ABO/RH: "RH Type ": POSITIVE

## 2021-02-25 MED ORDER — TERBUTALINE SULFATE 1 MG/ML IJ SOLN: 0.2500 mg | Freq: Once | INTRAMUSCULAR | Status: DC

## 2021-02-25 MED ORDER — TERBUTALINE SULFATE 1 MG/ML IJ SOLN
INTRAMUSCULAR | Status: AC
  Filled 2021-02-25: qty 1

## 2021-02-25 MED ORDER — LACTATED RINGERS IV SOLN: INTRAVENOUS | Status: DC

## 2021-02-25 NOTE — H&P (Signed)
FACULTY PRACTICE ANTEPARTUM ADMISSION HISTORY AND PHYSICAL NOTE   History of Present Illness: Michelle Stephenson is a 26 y.o. [id number] at [redacted]w[redacted]d admitted for observation for ECV for breech position.   Patient reports the fetal movement as active. Patient reports uterine contraction  activity as none. Patient reports  vaginal bleeding as none. Patient describes fluid per vagina as None.  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   left mons   Mental disorder    depression; PTSD   Sickle cell trait (HCC)     Past Surgical History:  Procedure Laterality Date   CHOLECYSTECTOMY     TONSILLECTOMY AND ADENOIDECTOMY      OB History  Gravida Para Term Preterm AB Living  13 1 1   11 1   SAB IAB Ectopic Multiple Live Births  11     0 1    # Outcome Date GA Lbr Len/2nd Weight Sex Delivery Anes PTL Lv  13 Current           12 Term 12/25/19 [redacted]w[redacted]d 61:44 / 00:12 2741 g F Vag-Spont EPI N LIV     Birth Comments: moulding/caput  11 SAB 07/2016          10 SAB 02/2016          9 SAB           8 SAB           7 SAB           6 SAB           5 SAB           4 SAB           3 SAB           2 SAB           1 SAB             Social History   Socioeconomic History   Marital status: Single    Spouse name: Not on file   Number of children: 0   Years of education: Not on file   Highest education level: Not on file  Occupational History   Occupation: Unemployed  Tobacco Use   Smoking status: Former    Packs/day: 0.25    Years: 1.00    Pack years: 0.25    Types: Cigarettes   Smokeless tobacco: Never  Vaping Use   Vaping Use: Never used  Substance and Sexual Activity   Alcohol  use: Yes   Drug use: Not Currently    Types: Marijuana    Comment: 01/18/2021 last used marj.   Sexual activity: Yes    Birth control/protection: None  Other Topics Concern   Not on file  Social History Narrative   Not on file   Social Determinants of Health   Financial Resource Strain: Medium Risk   Difficulty of Paying Living Expenses: Somewhat hard  Food Insecurity: Food Insecurity Present   Worried About Running Out of Food in the Last Year: Sometimes true   Ran Out of Food in the Last Year: Sometimes true  Transportation Needs: Unmet Transportation Needs  Lack of Transportation (Medical): No   Lack of Transportation (Non-Medical): Yes  Physical Activity: Insufficiently Active   Days of Exercise per Week: 3 days   Minutes of Exercise per Session: 20 min  Stress: Stress Concern Present   Feeling of Stress : Very much  Social Connections: Socially Isolated   Frequency of Communication with Friends and Family: Twice a week   Frequency of Social Gatherings with Friends and Family: Once a week   Attends Religious Services: Never   Marine scientist or Organizations: No   Attends Music therapist: Never   Marital Status: Never married    Family History  Problem Relation Age of Onset   Diabetes Maternal Grandmother    Hypertension Maternal Grandmother     Allergies  Allergen Reactions   Latex Itching    Medications Prior to Admission  Medication Sig Dispense Refill Last Dose   acyclovir (ZOVIRAX) 400 MG tablet Take 1 tablet (400 mg total) by mouth 3 (three) times daily. 90 tablet 3    prenatal vitamin w/FE, FA (NATACHEW) 29-1 MG CHEW chewable tablet Chew 1 tablet by mouth daily at 12 noon.       Review of Systems - Negative except as noted in HPI  Vitals:  BP 101/65    Pulse 90    Temp 97.8 F (36.6 C) (Oral)    Resp 18    Ht 5\' 2"  (1.575 m)    Wt 99.8 kg    LMP 06/03/2020    BMI 40.24 kg/m  Physical Examination: CONSTITUTIONAL:  Well-developed, well-nourished female in no acute distress.  HENT:  Normocephalic, atraumatic, External right and left ear normal. Oropharynx is clear and moist EYES: Conjunctivae and EOM are normal. Pupils are equal, round, and reactive to light. No scleral icterus.  NECK: Normal range of motion, supple, no masses SKIN: Skin is warm and dry. No rash noted. Not diaphoretic. No erythema. No pallor. NEUROLOGIC: Alert and oriented to person, place, and time. Normal reflexes, muscle tone coordination. No cranial nerve deficit noted. PSYCHIATRIC: Normal mood and affect. Normal behavior. Normal judgment and thought content.  ABDOMEN: Soft, nontender, nondistended, gravid. MUSCULOSKELETAL: Normal range of motion. No edema and no tenderness. 2+ distal pulses.  Fetal Monitoring:Baseline: 140 bpm, Variability: Good {> 6 bpm), Accelerations: Reactive, and Decelerations: Absent Tocometer: Irregular contractions  Bedside US - fetus is in cephalic position (vs oblique)   Labs:  Results for orders placed or performed during the hospital encounter of 02/25/21 (from the past 24 hour(s))  CBC   Collection Time: 02/25/21  7:31 AM  Result Value Ref Range   WBC 8.6 4.0 - 10.5 K/uL   RBC 4.62 3.87 - 5.11 MIL/uL   Hemoglobin 12.0 12.0 - 15.0 g/dL   HCT 36.0 36.0 - 46.0 %   MCV 77.9 (L) 80.0 - 100.0 fL   MCH 26.0 26.0 - 34.0 pg   MCHC 33.3 30.0 - 36.0 g/dL   RDW 14.3 11.5 - 15.5 %   Platelets 198 150 - 400 K/uL   nRBC 0.0 0.0 - 0.2 %  Type and screen [place]   Collection Time: 02/25/21  7:37 AM  Result Value Ref Range   ABO/RH(D) A POS    Antibody Screen NEG    Sample Expiration      02/28/2021,2359 Performed at [facility], [place] [address] [place], [place] [address]     Imaging Studies: No results found.   Assessment and Plan: Patient  Active Problem List   Diagnosis Date Noted   Marijuana use 02/04/2021   Encounter for supervision of normal pregnancy, antepartum  02/03/2021   Late prenatal care in second trimester 02/03/2021  Breech presentation - per pt, the baby had been footling breech.  - She presented for ECV. Prior to consent and terbutaline, I did a BSUS which shows cephalic vs oblique presentation.  - We discussed the possibility of unstable lie. Discussed if breech when she comes in for labor/induction, she would have the option for ECV at that time as well vs c-section. Discussed chance of success is highest when not in labor and when no SROM but at this point, no additional intervention needed. - She will keep her appointment as scheduled with Knute Neu, CNM.  - NST reactive, no decels - D/C home with binder for today. Discussed walking today to help baby settle into pelvis vs bouncing on a ball    Radene Gunning, MD, [place] Attending [place], [facility]

## 2021-02-25 NOTE — Progress Notes (Signed)
Abdominal binder placed per Dr. Para March. Discharge instructions reviewed with patient. IV removed. FHR tracing reviewed with Dr. Para March. Patient dicharged to home.

## 2021-02-26 ENCOUNTER — Ambulatory Visit (INDEPENDENT_AMBULATORY_CARE_PROVIDER_SITE_OTHER): Payer: 59 | Admitting: Advanced Practice Midwife

## 2021-02-26 ENCOUNTER — Encounter: Payer: Self-pay | Admitting: Obstetrics & Gynecology

## 2021-02-26 VITALS — BP 132/72 | HR 99 | Wt 224.0 lb

## 2021-02-26 DIAGNOSIS — Z3A38 38 weeks gestation of pregnancy: Secondary | ICD-10-CM | POA: Diagnosis not present

## 2021-02-26 DIAGNOSIS — Z3480 Encounter for supervision of other normal pregnancy, unspecified trimester: Secondary | ICD-10-CM

## 2021-02-26 LAB — RUBELLA SCREEN: Rubella: 1.5 index (ref >0.99)

## 2021-02-26 LAB — RPR: RPR Ser Ql: NONREACTIVE

## 2021-02-26 LAB — HCV AB W REFLEX TO QUANT PCR: HCV Ab: <0.1 s/co ratio (ref 0.0–0.9)

## 2021-02-26 LAB — HCV INTERPRETATION

## 2021-02-26 NOTE — Progress Notes (Signed)
° °  LOW-RISK PREGNANCY VISIT Patient name: Michelle Stephenson MRN [id number]  Date of birth: 01-Jan-1996 Chief Complaint:   Routine Prenatal Visit  History of Present Illness:   Michelle Stephenson is a 26 y.o. [id number] female at [redacted]w[redacted]d with an Estimated Date of Delivery: 03/10/21 being seen today for ongoing management of a low-risk pregnancy.  Today she reports  doing well but concerned re baby's presentation; had an appt for an ECV yesterday but the baby was vtx/oblique  . Contractions: Irregular. Vag. Bleeding: None.  Movement: Present. denies leaking of fluid. Review of Systems:   Pertinent items are noted in HPI Denies abnormal vaginal discharge w/ itching/odor/irritation, headaches, visual changes, shortness of breath, chest pain, abdominal pain, severe nausea/vomiting, or problems with urination or bowel movements unless otherwise stated above. Pertinent History Reviewed:  Reviewed past medical,surgical, social, obstetrical and family history.  Reviewed problem list, medications and allergies. Physical Assessment:   Vitals:   02/26/21 1606  BP: 132/72  Pulse: 99  Weight: 224 lb (101.6 kg)  Body mass index is 40.97 kg/m.        Physical Examination:   General appearance: Well appearing, and in no distress  Mental status: Alert, oriented to person, place, and time  Skin: Warm & dry  Cardiovascular: Normal heart rate noted  Respiratory: Normal respiratory effort, no distress  Abdomen: Soft, gravid, nontender  Pelvic: Cervical exam deferred         Extremities: Edema: Trace  Fetal Status: Fetal Heart Rate (bpm): 137   Movement: Present Presentation: Vertex  No results found for this or any previous visit (from the past 24 hour(s)).  Assessment & Plan:  1) Low-risk pregnancy [id number] at [redacted]w[redacted]d with an Estimated Date of Delivery: 03/10/21   2) Unstable lie, vtx today, however pt is very concerned about trying to avoid a C/S due to fetal malpresentation in labor; rev'd R&Bs of inducing at  39wks and she elects to proceed with this;  IOL form faxed via Epic and orders placed   3) Desires BTL, signed consent 02/03/21 and she understands this may or may not be able to occur immediately PP due to the time frame and/or body habitus   Meds: No orders of the defined types were placed in this encounter.  Labs/procedures today: IOL scheduled for Jan 10th in the AM (no other patients for morning induction at time of posting)  Plan:  Continue routine obstetrical care   Reviewed: Term labor symptoms and general obstetric precautions including but not limited to vaginal bleeding, contractions, leaking of fluid and fetal movement were reviewed in detail with the patient.  All questions were answered. Has home bp cuff. Check bp weekly, let us know if >140/90.   Follow-up: Return for cancel all OB appts.  No orders of the defined types were placed in this encounter.  Arabella Merles CNM 02/26/2021 4:53 PM

## 2021-02-26 NOTE — Patient Instructions (Signed)
Your induction is scheduled for Jan 10th. Please DO NOT show up at the time you see in MyChart. Someone from Labor & Delivery will call you on the date of your induction to let you know what time to come in. Please keep your phone on and with you at all times, you have 1 hour to respond to them to let them know you are on your way.  Go to the main desk at the [facility] and let them know you are there to be induced. They will send someone from Labor & Delivery to come get you.  You will get a call from a nurse from the hospital within the next day or so to go over some information, she will also schedule you to go to the hospital a few days before you induction to have your Covid test. If you have any questions, please let us know.     ** try these positions: [email or web address]

## 2021-03-03 ENCOUNTER — Encounter: Payer: Self-pay | Admitting: Advanced Practice Midwife

## 2021-03-04 ENCOUNTER — Other Ambulatory Visit: Payer: Self-pay

## 2021-03-04 ENCOUNTER — Inpatient Hospital Stay (HOSPITAL_COMMUNITY): Payer: 59 | Admitting: Anesthesiology

## 2021-03-04 ENCOUNTER — Encounter (HOSPITAL_COMMUNITY): Payer: Self-pay | Admitting: Family Medicine

## 2021-03-04 ENCOUNTER — Inpatient Hospital Stay (HOSPITAL_COMMUNITY): Payer: 59

## 2021-03-04 ENCOUNTER — Inpatient Hospital Stay (HOSPITAL_COMMUNITY)
Admission: AD | Admit: 2021-03-04 | Discharge: 2021-03-05 | DRG: 806 | Disposition: A | Discharge status: Home / Self Care | Payer: 59 | Attending: Family Medicine | Admitting: Family Medicine

## 2021-03-04 DIAGNOSIS — A6000 Herpesviral infection of urogenital system, unspecified: Secondary | ICD-10-CM | POA: Diagnosis present

## 2021-03-04 DIAGNOSIS — O26893 Other specified pregnancy related conditions, third trimester: Secondary | ICD-10-CM | POA: Diagnosis present

## 2021-03-04 DIAGNOSIS — O99324 Drug use complicating childbirth: Secondary | ICD-10-CM | POA: Diagnosis present

## 2021-03-04 DIAGNOSIS — F1290 Cannabis use, unspecified, uncomplicated: Secondary | ICD-10-CM | POA: Diagnosis present

## 2021-03-04 DIAGNOSIS — O99214 Obesity complicating childbirth: Secondary | ICD-10-CM | POA: Diagnosis present

## 2021-03-04 DIAGNOSIS — Z3490 Encounter for supervision of normal pregnancy, unspecified, unspecified trimester: Secondary | ICD-10-CM

## 2021-03-04 DIAGNOSIS — Z30017 Encounter for initial prescription of implantable subdermal contraceptive: Secondary | ICD-10-CM

## 2021-03-04 DIAGNOSIS — O0932 Supervision of pregnancy with insufficient antenatal care, second trimester: Secondary | ICD-10-CM

## 2021-03-04 DIAGNOSIS — O9832 Other infections with a predominantly sexual mode of transmission complicating childbirth: Secondary | ICD-10-CM | POA: Diagnosis present

## 2021-03-04 DIAGNOSIS — Z3A39 39 weeks gestation of pregnancy: Secondary | ICD-10-CM | POA: Diagnosis not present

## 2021-03-04 DIAGNOSIS — D573 Sickle-cell trait: Secondary | ICD-10-CM | POA: Diagnosis present

## 2021-03-04 DIAGNOSIS — Z87891 Personal history of nicotine dependence: Secondary | ICD-10-CM | POA: Diagnosis not present

## 2021-03-04 DIAGNOSIS — Z20822 Contact with and (suspected) exposure to covid-19: Secondary | ICD-10-CM | POA: Diagnosis present

## 2021-03-04 DIAGNOSIS — O320XX0 Maternal care for unstable lie, not applicable or unspecified: Secondary | ICD-10-CM | POA: Diagnosis present

## 2021-03-04 DIAGNOSIS — O9902 Anemia complicating childbirth: Secondary | ICD-10-CM | POA: Diagnosis present

## 2021-03-04 LAB — RPR: RPR Ser Ql: NONREACTIVE

## 2021-03-04 LAB — TYPE AND SCREEN
ABO/RH(D): A POS
Antibody Screen: NEGATIVE

## 2021-03-04 LAB — RAPID URINE DRUG SCREEN, HOSP PERFORMED
Amphetamines: NOT DETECTED
Barbiturates: NOT DETECTED
Benzodiazepines: NOT DETECTED
Cocaine: NOT DETECTED
Opiates: NOT DETECTED
Tetrahydrocannabinol: NOT DETECTED

## 2021-03-04 LAB — CBC
HCT: 35.6 % — ABNORMAL LOW (ref 36.0–46.0)
Hemoglobin: 11.6 g/dL — ABNORMAL LOW (ref 12.0–15.0)
MCH: 25.1 pg — ABNORMAL LOW (ref 26.0–34.0)
MCHC: 32.6 g/dL (ref 30.0–36.0)
MCV: 77.1 fL — ABNORMAL LOW (ref 80.0–100.0)
Platelets: 210 10*3/uL (ref 150–400)
RBC: 4.62 MIL/uL (ref 3.87–5.11)
RDW: 14.6 % (ref 11.5–15.5)
WBC: 7.3 10*3/uL (ref 4.0–10.5)
nRBC: 0 % (ref 0.0–0.2)

## 2021-03-04 LAB — RESP PANEL BY RT-PCR (FLU A&B, COVID) ARPGX2
Influenza A by PCR: NEGATIVE
Influenza B by PCR: NEGATIVE
SARS Coronavirus 2 by RT PCR: NEGATIVE

## 2021-03-04 MED ORDER — WITCH HAZEL-GLYCERIN EX PADS: 1.0000 "application " | MEDICATED_PAD | CUTANEOUS | Status: DC | PRN

## 2021-03-04 MED ORDER — OXYCODONE-ACETAMINOPHEN 5-325 MG PO TABS: 2.0000 | ORAL_TABLET | ORAL | Status: DC | PRN

## 2021-03-04 MED ORDER — MISOPROSTOL 25 MCG QUARTER TABLET
25.0000 ug | ORAL_TABLET | ORAL | Status: DC | PRN
  Filled 2021-03-04: qty 1

## 2021-03-04 MED ORDER — TETANUS-DIPHTH-ACELL PERTUSSIS 5-2.5-18.5 LF-MCG/0.5 IM SUSY: 0.5000 mL | PREFILLED_SYRINGE | Freq: Once | INTRAMUSCULAR | Status: DC

## 2021-03-04 MED ORDER — TERBUTALINE SULFATE 1 MG/ML IJ SOLN: 0.2500 mg | Freq: Once | INTRAMUSCULAR | Status: DC | PRN

## 2021-03-04 MED ORDER — PHENYLEPHRINE 40 MCG/ML (10ML) SYRINGE FOR IV PUSH (FOR BLOOD PRESSURE SUPPORT): 80.0000 ug | PREFILLED_SYRINGE | INTRAVENOUS | Status: DC | PRN

## 2021-03-04 MED ORDER — COCONUT OIL OIL: 1.0000 "application " | TOPICAL_OIL | Status: DC | PRN

## 2021-03-04 MED ORDER — LACTATED RINGERS IV SOLN
500.0000 mL | Freq: Once | INTRAVENOUS | Status: AC
  Administered 2021-03-04: 500 mL via INTRAVENOUS

## 2021-03-04 MED ORDER — MISOPROSTOL 50MCG HALF TABLET
50.0000 ug | ORAL_TABLET | ORAL | Status: DC | PRN
  Administered 2021-03-04: 50 ug via BUCCAL
  Filled 2021-03-04: qty 1

## 2021-03-04 MED ORDER — SOD CITRATE-CITRIC ACID 500-334 MG/5ML PO SOLN: 30.0000 mL | ORAL | Status: DC | PRN

## 2021-03-04 MED ORDER — LIDOCAINE HCL (PF) 1 % IJ SOLN: 30.0000 mL | INTRAMUSCULAR | Status: DC | PRN

## 2021-03-04 MED ORDER — OXYCODONE-ACETAMINOPHEN 5-325 MG PO TABS: 1.0000 | ORAL_TABLET | ORAL | Status: DC | PRN

## 2021-03-04 MED ORDER — ONDANSETRON HCL 4 MG PO TABS: 4.0000 mg | ORAL_TABLET | ORAL | Status: DC | PRN

## 2021-03-04 MED ORDER — BENZOCAINE-MENTHOL 20-0.5 % EX AERO: 1.0000 "application " | INHALATION_SPRAY | CUTANEOUS | Status: DC | PRN

## 2021-03-04 MED ORDER — LACTATED RINGERS IV SOLN: INTRAVENOUS | Status: DC

## 2021-03-04 MED ORDER — FENTANYL CITRATE (PF) 100 MCG/2ML IJ SOLN: 100.0000 ug | INTRAMUSCULAR | Status: DC | PRN

## 2021-03-04 MED ORDER — DIBUCAINE (PERIANAL) 1 % EX OINT: 1.0000 "application " | TOPICAL_OINTMENT | CUTANEOUS | Status: DC | PRN

## 2021-03-04 MED ORDER — ZOLPIDEM TARTRATE 5 MG PO TABS: 5.0000 mg | ORAL_TABLET | Freq: Every evening | ORAL | Status: DC | PRN

## 2021-03-04 MED ORDER — OXYTOCIN BOLUS FROM INFUSION
333.0000 mL | Freq: Once | INTRAVENOUS | Status: AC
  Administered 2021-03-04: 333 mL via INTRAVENOUS

## 2021-03-04 MED ORDER — ACETAMINOPHEN 325 MG PO TABS: 650.0000 mg | ORAL_TABLET | ORAL | Status: DC | PRN

## 2021-03-04 MED ORDER — DIPHENHYDRAMINE HCL 50 MG/ML IJ SOLN: 12.5000 mg | INTRAMUSCULAR | Status: DC | PRN

## 2021-03-04 MED ORDER — IBUPROFEN 600 MG PO TABS
600.0000 mg | ORAL_TABLET | Freq: Four times a day (QID) | ORAL | Status: DC
  Administered 2021-03-04 – 2021-03-05 (×3): 600 mg via ORAL
  Filled 2021-03-04 (×3): qty 1

## 2021-03-04 MED ORDER — OXYTOCIN-SODIUM CHLORIDE 30-0.9 UT/500ML-% IV SOLN
2.5000 [IU]/h | INTRAVENOUS | Status: DC
  Administered 2021-03-04: 2.5 [IU]/h via INTRAVENOUS
  Filled 2021-03-04: qty 500

## 2021-03-04 MED ORDER — LACTATED RINGERS IV SOLN: 500.0000 mL | INTRAVENOUS | Status: DC | PRN

## 2021-03-04 MED ORDER — MISOPROSTOL 50MCG HALF TABLET
ORAL_TABLET | ORAL | Status: AC
  Administered 2021-03-04: 50 ug via BUCCAL
  Filled 2021-03-04: qty 1

## 2021-03-04 MED ORDER — EPHEDRINE 5 MG/ML INJ: 10.0000 mg | INTRAVENOUS | Status: DC | PRN

## 2021-03-04 MED ORDER — ONDANSETRON HCL 4 MG/2ML IJ SOLN: 4.0000 mg | INTRAMUSCULAR | Status: DC | PRN

## 2021-03-04 MED ORDER — PRENATAL MULTIVITAMIN CH
1.0000 | ORAL_TABLET | Freq: Every day | ORAL | Status: DC
  Administered 2021-03-05: 1 via ORAL
  Filled 2021-03-04: qty 1

## 2021-03-04 MED ORDER — SENNOSIDES-DOCUSATE SODIUM 8.6-50 MG PO TABS
2.0000 | ORAL_TABLET | Freq: Every day | ORAL | Status: DC
  Administered 2021-03-05: 2 via ORAL
  Filled 2021-03-04: qty 2

## 2021-03-04 MED ORDER — FENTANYL-BUPIVACAINE-NACL 0.5-0.125-0.9 MG/250ML-% EP SOLN
12.0000 mL/h | EPIDURAL | Status: DC | PRN
  Administered 2021-03-04: 12 mL/h via EPIDURAL
  Filled 2021-03-04: qty 250

## 2021-03-04 MED ORDER — ONDANSETRON HCL 4 MG/2ML IJ SOLN: 4.0000 mg | Freq: Four times a day (QID) | INTRAMUSCULAR | Status: DC | PRN

## 2021-03-04 MED ORDER — DIPHENHYDRAMINE HCL 25 MG PO CAPS: 25.0000 mg | ORAL_CAPSULE | Freq: Four times a day (QID) | ORAL | Status: DC | PRN

## 2021-03-04 MED ORDER — LIDOCAINE HCL (PF) 1 % IJ SOLN
INTRAMUSCULAR | Status: DC | PRN
  Administered 2021-03-04: 8 mL via EPIDURAL

## 2021-03-04 MED ORDER — SIMETHICONE 80 MG PO CHEW: 80.0000 mg | CHEWABLE_TABLET | ORAL | Status: DC | PRN

## 2021-03-04 NOTE — Progress Notes (Signed)
Michelle Stephenson is a 26 y.o. [id number] at [redacted]w[redacted]d by LMP admitted for elective IOL.  Subjective: Doing well, more comfortable with epidural now. States she is hungry. No acute concerns at this time. Discussed next steps for induction of labor, including AROM. Patient is in agreement with AROM at this time.   Objective: BP 121/79    Pulse 79    Temp 98 F (36.7 C) (Oral)    Resp 16    Ht 5\' 2"  (1.575 m)    Wt 100.5 kg    LMP 06/03/2020    SpO2 100%    BMI 40.53 kg/m  No intake/output data recorded. No intake/output data recorded.  FHT:  FHR: 150 bpm, variability: moderate,  accelerations:  Present,  decelerations:  rare variable UC:   regular, every 1-2 minutes SVE:   Dilation: 6.5 Effacement (%): 90 Station: 0 Exam by:: Dr. [email or web address] and Dr. Omer Jack  Labs: Lab Results  Component Value Date   WBC 7.3 03/04/2021   HGB 11.6 (L) 03/04/2021   HCT 35.6 (L) 03/04/2021   MCV 77.1 (L) 03/04/2021   PLT 210 03/04/2021    Assessment / Plan: Induction of labor due to elective IOL,  progressing well. S/p cytotec x2.   Labor: Progressing normally Preeclampsia:   N/A Fetal Wellbeing:  Category I Pain Control:  Epidural I/D:  n/a Anticipated MOD:  NSVD  05/02/2021 03/04/2021, 4:10 PM

## 2021-03-04 NOTE — Discharge Summary (Addendum)
Postpartum Discharge Summary  Date of Service updated: 03/05/2021     Patient Name: Michelle Stephenson DOB: Feb 04, 1996 MRN: [id number]  Date of admission: 03/04/2021 Delivery date:03/04/2021  Delivering provider: [facility]  Date of discharge: 03/05/2021  Admitting diagnosis: Unstable fetal lie [O32.0XX0] Intrauterine pregnancy: 102w1d    Secondary diagnosis:  Principal Problem:   Encounter for supervision of normal pregnancy, antepartum Active Problems:   Late prenatal care in second trimester   Marijuana use   Unstable fetal lie  Additional problems: None    Discharge diagnosis: Term Pregnancy Delivered                                              Post partum procedures: None Augmentation: AROM and Cytotec Complications: None  Hospital course: Induction of Labor With Vaginal Delivery   26y.o. yo [id number] [id number] admitted to the hospital 03/04/2021 for induction of labor.  Indication for induction: Unstable lie and Elective.  Patient had an uncomplicated labor course as follows: Membrane Rupture Time/Date: 3:55 PM ,03/04/2021   Delivery Method:Vaginal, Spontaneous  Episiotomy: None  Lacerations:  None  Details of delivery can be found in separate delivery note.  Patient had a routine postpartum course. Patient is discharged home 03/05/21.  Newborn Data: Birth date:03/04/2021  Birth time:6:00 PM  Gender:Female  Living status:Living  Apgars:8 ,9  Weight:3300 g   Magnesium Sulfate received: No BMZ received: No Rhophylac:N/A MMR:N/A T-DaP:Given prenatally Flu: No Transfusion:No  Physical exam  Vitals:   03/04/21 2025 03/04/21 2146 03/05/21 0200 03/05/21 0446  BP: 132/71 114/62 (!) 112/58 114/72  Pulse: 91 76 86 83  Resp: 18 18 18 18   Temp: 100.3 F (37.9 C) 99 F (37.2 C) 98 F (36.7 C) 98.3 F (36.8 C)  TempSrc: Oral Oral Oral Oral  SpO2: 100% 100% 100% 100%  Weight:      Height:       General: Well-appearing AAF, alert, cooperative,  and no distress laying comfortably in bed Lochia: appropriate Uterine Fundus: firm Incision: N/A DVT Evaluation: No evidence of DVT seen on physical exam. Negative Homan's sign. No cords or calf tenderness. Labs: Lab Results  Component Value Date   WBC 10.8 (H) 03/05/2021   HGB 11.1 (L) 03/05/2021   HCT 33.6 (L) 03/05/2021   MCV 77.2 (L) 03/05/2021   PLT 190 03/05/2021   No flowsheet data found. Edinburgh Score: Edinburgh Postnatal Depression Scale Screening Tool 03/05/2021  I have been able to laugh and see the funny side of things. 0  I have looked forward with enjoyment to things. 1  I have blamed myself unnecessarily when things went wrong. 1  I have been anxious or worried for no good reason. 1  I have felt scared or panicky for no good reason. 0  Things have been getting on top of me. 2  I have been so unhappy that I have had difficulty sleeping. 1  I have felt sad or miserable. 1  I have been so unhappy that I have been crying. 0  The thought of harming myself has occurred to me. 0  Edinburgh Postnatal Depression Scale Total 7     After visit meds:  Allergies as of 03/05/2021       Reactions   Latex Itching        Medication List  TAKE these medications    acetaminophen 325 MG tablet Commonly known as: Tylenol Take 2 tablets (650 mg total) by mouth every 4 (four) hours as needed (for pain scale < 4).   acyclovir 400 MG tablet Commonly known as: ZOVIRAX Take 1 tablet (400 mg total) by mouth 3 (three) times daily.   ibuprofen 600 MG tablet Commonly known as: ADVIL Take 1 tablet (600 mg total) by mouth every 6 (six) hours.   prenatal vitamin w/FE, FA 29-1 MG Chew chewable tablet Chew 1 tablet by mouth daily at 12 noon.         Discharge home in stable condition Infant Feeding: Breast Infant Disposition:home with mother Discharge instruction: per After Visit Summary and Postpartum booklet. Activity: Advance as tolerated. Pelvic rest for 6  weeks.  Diet: routine diet Future Appointments: Future Appointments  Date Time Provider [place]  04/10/2021 10:10 AM Florian Buff, MD [facility]   Follow up Visit:   Please schedule this patient for a In person postpartum visit in 6 weeks with the following provider: Any provider. Additional Postpartum F/U: pre-op visit for interval BTL with GYN MD ONLY   Low risk pregnancy complicated by:  None Delivery mode:  Vaginal, Spontaneous  Anticipated Birth Control:  Plans Interval BTL and Nexplanon   03/05/2021 Fabiola Backer, MD

## 2021-03-04 NOTE — H&P (Signed)
OBSTETRIC ADMISSION HISTORY AND PHYSICAL  Michelle Stephenson is a 26 y.o. female [phone] with IUP at [redacted]w[redacted]d by LMP presenting for eIOL. She reports +FMs, no LOF, no VB, no blurry vision, headaches, peripheral edema, or RUQ pain.  She plans on breast feeding. She is planning for BTL postpartum for contraception.   She received her prenatal care at [facility]. She transferred care around 35 weeks from [place].   Dating: By LMP --->  Estimated Date of Delivery: 03/10/21  Sono:   Per records in media tab, unremarkable anatomy scan consistent with dates.   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   Mental disorder    depression; PTSD   Sickle cell trait ([facility])     Past Surgical History: Past Surgical History:  Procedure Laterality Date   CHOLECYSTECTOMY     TONSILLECTOMY AND ADENOIDECTOMY      Obstetrical History: OB History     Gravida  13   Para  1   Term  1   Preterm      AB  11   Living  1      SAB  11   IAB      Ectopic      Multiple  0   Live Births  1           Social History Social History   Socioeconomic History   Marital status: Single    Spouse name: Not on file   Number of children: 0   Years of education: Not on file   Highest education level: Not on file  Occupational History   Occupation: Unemployed  Tobacco Use   Smoking status: Former    Packs/day: 0.25    Years: 1.00    Pack years: 0.25    Types: Cigarettes   Smokeless tobacco: Never  Vaping Use   Vaping Use: Never used  Substance and Sexual Activity   Alcohol use: Yes   Drug use: Not Currently    Types: Marijuana    Comment: 01/18/2021 last used marj.   Sexual activity: Yes    Birth  control/protection: None  Other Topics Concern   Not on file  Social History Narrative   Not on file   Social Determinants of Health   Financial Resource Strain: Medium Risk   Difficulty of Paying Living Expenses: Somewhat hard  Food Insecurity: Food Insecurity Present   Worried About Charity fundraiser in the Last Year: Sometimes true   Arboriculturist in the Last Year: Sometimes true  Transportation Needs: Unmet Transportation Needs   Lack of Transportation (Medical): No   Lack of Transportation (Non-Medical): Yes  Physical Activity: Insufficiently Active   Days of Exercise per Week: 3 days   Minutes of Exercise per Session: 20 min  Stress: Stress Concern Present   Feeling of Stress : Very much  Social Connections: Socially Isolated   Frequency of Communication with Friends and Family: Twice a week   Frequency of Social Gatherings with Friends and Family: Once a week   Attends Religious Services: Never   Marine scientist or Organizations: No   Attends Archivist Meetings: Never   Marital Status: Never  married    Family History: Family History  Problem Relation Age of Onset   Diabetes Maternal Grandmother    Hypertension Maternal Grandmother     Allergies: Allergies  Allergen Reactions   Latex Itching    Medications Prior to Admission  Medication Sig Dispense Refill Last Dose   prenatal vitamin w/FE, FA (NATACHEW) 29-1 MG CHEW chewable tablet Chew 1 tablet by mouth daily at 12 noon.   Past Week   acyclovir (ZOVIRAX) 400 MG tablet Take 1 tablet (400 mg total) by mouth 3 (three) times daily. 90 tablet 3 Unknown    Review of Systems  All systems reviewed and negative except as stated in HPI  Blood pressure 112/66, pulse 82, temperature 98.1 F (36.7 C), temperature source Oral, resp. rate 18, height 5\' 2"  (1.575 m), weight 100.5 kg, last menstrual period 06/03/2020, unknown if currently breastfeeding.  General appearance: alert, cooperative, and  no distress Lungs: normal work of breathing on room air  Heart: normal rate, warm and well perfused  Abdomen: soft, non-tender, gravid  Pelvic: normal appearing external genitalia without lesions  Extremities: no LE edema or calf tenderness to palpation   Presentation:  Cephalic Fetal monitoring: Baseline 130 bpm, moderate variability, + accels, no decels  Uterine activity: Irregular contractions  Dilation: 2 Effacement (%): Thick Station: Ballotable Exam by:: Dr. Gwenlyn Perking   Prenatal labs: ABO, Rh: --/--/A POS (01/10 0600) Antibody: NEG (01/10 0600) Rubella: 1.50 (01/03 0737) RPR: NON REACTIVE (01/03 0737)  HBsAg: NON REACTIVE (01/03 0730)  HIV: Non Reactive (12/20 0844)  GBS: Negative/-- (12/19 1511)  2 hr Glucola normal  Genetic screening - Carrier for SCT, MTHFR carrier  Anatomy US normal per report   Prenatal Transfer Tool  Maternal Diabetes: No Genetic Screening: Carrier for SCT, MTHFR carrier  Maternal Ultrasounds/Referrals: Normal Fetal Ultrasounds or other Referrals:  None Maternal Substance Abuse:  Yes:  Type: Marijuana, denies recent use  Significant Maternal Medications:  None Significant Maternal Lab Results: Group B Strep negative  Results for orders placed or performed during the hospital encounter of 03/04/21 (from the past 24 hour(s))  Resp Panel by RT-PCR (Flu A&B, Covid) Nasopharyngeal Swab   Collection Time: 03/04/21  5:50 AM   Specimen: Nasopharyngeal Swab; Nasopharyngeal(NP) swabs in vial transport medium  Result Value Ref Range   SARS Coronavirus 2 by RT PCR NEGATIVE NEGATIVE   Influenza A by PCR NEGATIVE NEGATIVE   Influenza B by PCR NEGATIVE NEGATIVE  CBC   Collection Time: 03/04/21  5:58 AM  Result Value Ref Range   WBC 7.3 4.0 - 10.5 K/uL   RBC 4.62 3.87 - 5.11 MIL/uL   Hemoglobin 11.6 (L) 12.0 - 15.0 g/dL   HCT 35.6 (L) 36.0 - 46.0 %   MCV 77.1 (L) 80.0 - 100.0 fL   MCH 25.1 (L) 26.0 - 34.0 pg   MCHC 32.6 30.0 - 36.0 g/dL   RDW 14.6 11.5  - 15.5 %   Platelets 210 150 - 400 K/uL   nRBC 0.0 0.0 - 0.2 %  Type and screen   Collection Time: 03/04/21  6:00 AM  Result Value Ref Range   ABO/RH(D) A POS    Antibody Screen NEG    Sample Expiration      03/07/2021,2359 Performed at [facility], [address], [place], [place] [address]     Patient Active Problem List   Diagnosis Date Noted   Unstable fetal lie 03/04/2021   Marijuana use 02/04/2021   Encounter  for supervision of normal pregnancy, antepartum 02/03/2021   Late prenatal care in second trimester 02/03/2021    Assessment/Plan:  Michelle Stephenson is a 86 y.o. [id number] at [redacted]w[redacted]d here for eIOL.   #Labor: Discussed Cytotec and foley balloon placement with patient. She is agreeable to Cytotec but would like to avoid balloon for now. Buccal Cytotec given. Will reassess in 4 hours.  #Pain: PRN; planning for epidural when ready  #FWB: Cat 1 #ID:  GBS neg #MOF: Breast  #MOC: BTL (consent signed 12/12)  #Hx genital lesions: Reports being incorrectly diagnosed with genital HSV in the past. Reports it was actually pus filled lesions (boils). HSV-2 antibody testing completed this pregnancy and negative. No recent lesions and normal perineum on exam. Okay to proceed with vaginal delivery.   #Hx marijuana use: Reports using prior to pregnancy and once during pregnancy when she accidentally ate an edible at a family gathering. Discussed UDS on admission and patient is agreeable. Denies recent use.  #Hx of bipolar disorder: Stable mood, no medications currently. Will continue to monitor.   Genia Del, MD  03/04/2021, 7:12 AM

## 2021-03-04 NOTE — Anesthesia Procedure Notes (Signed)
Epidural Patient location during procedure: OB Start time: 03/04/2021 2:40 PM End time: 03/04/2021 2:52 PM  Staffing Anesthesiologist: Mellody Dance, MD Performed: anesthesiologist   Preanesthetic Checklist Completed: patient identified, IV checked, site marked, risks and benefits discussed, monitors and equipment checked, pre-op evaluation and timeout performed  Epidural Patient position: sitting Prep: DuraPrep Patient monitoring: heart rate, cardiac monitor, continuous pulse ox and blood pressure Approach: midline Location: L2-L3 Injection technique: LOR saline  Needle:  Needle type: Tuohy  Needle gauge: 17 G Needle length: 9 cm Needle insertion depth: 7 cm Catheter type: closed end flexible Catheter size: 20 Guage Catheter at skin depth: 12 cm Test dose: negative and Other  Assessment Events: blood not aspirated, injection not painful, no injection resistance and negative IV test  Additional Notes Informed consent obtained prior to proceeding including risk of failure, 1% risk of PDPH, risk of minor discomfort and bruising.  Discussed rare but serious complications including epidural abscess, permanent nerve injury, epidural hematoma.  Discussed alternatives to epidural analgesia and patient desires to proceed.  Timeout performed pre-procedure verifying patient name, procedure, and platelet count.  Patient tolerated procedure well.

## 2021-03-04 NOTE — Anesthesia Preprocedure Evaluation (Signed)
Anesthesia Evaluation  Patient identified by MRN, date of birth, ID band Patient awake    Reviewed: Allergy & Precautions, NPO status , Patient's Chart, lab work & pertinent test results  Airway Mallampati: II  TM Distance: >3 FB Neck ROM: Full    Dental no notable dental hx.    Pulmonary neg pulmonary ROS, former smoker,    Pulmonary exam normal breath sounds clear to auscultation       Cardiovascular negative cardio ROS Normal cardiovascular exam Rhythm:Regular Rate:Normal     Neuro/Psych PSYCHIATRIC DISORDERS Anxiety Depression negative neurological ROS     GI/Hepatic negative GI ROS, Neg liver ROS,   Endo/Other  Morbid obesity  Renal/GU negative Renal ROS  negative genitourinary   Musculoskeletal negative musculoskeletal ROS (+)   Abdominal   Peds  Hematology negative hematology ROS (+) Sickle cell trait ,   Anesthesia Other Findings   Reproductive/Obstetrics (+) Pregnancy                             Anesthesia Physical Anesthesia Plan  ASA: 3  Anesthesia Plan: Epidural   Post-op Pain Management:    Induction:   PONV Risk Score and Plan: 2 and Treatment may vary due to age or medical condition  Airway Management Planned: Natural Airway  Additional Equipment: None  Intra-op Plan:   Post-operative Plan:   Informed Consent: I have reviewed the patients History and Physical, chart, labs and discussed the procedure including the risks, benefits and alternatives for the proposed anesthesia with the patient or authorized representative who has indicated his/her understanding and acceptance.       Plan Discussed with: Anesthesiologist  Anesthesia Plan Comments:         Anesthesia Quick Evaluation

## 2021-03-04 NOTE — Progress Notes (Signed)
S: Patient seen at the bedside for dayteam introductions, she reports feeling CTX now since she has received her first dose of cytotec. She does not have any other concerns at this time. We discussed her plan of care, including recheck at approximately 11 AM. At that time, will determine fetal lie and next best steps, patient is understanding and in agreement.   O: Vitals with BMI 03/04/2021 03/04/2021 02/26/2021  Height - 5\' 2"  -  Weight - 221 lbs 10 oz 224 lbs  BMI - 40.52 40.96  Systolic 94 112 132  Diastolic 53 66 72  Pulse 72 82 99   Fetal monitoring: Baseline 135 bpm, moderate variability, accelerations present, no decelerations Uterine activity: CTX q4-6 min  A/P: Michelle Stephenson is a 26 y.o. 22 at [redacted]w[redacted]d here for eIOL.  #Labor: Continue expectant management for NSVD

## 2021-03-04 NOTE — Progress Notes (Signed)
RN entered room and patient out of bed independently. Right leg still numb and unstable. Pt. re-educated about being a fall risk after epidural. Pt. Assisted back into bed and baby wrapped and taken to warmer. Pt. Agrees to ask for assistance next time.

## 2021-03-05 ENCOUNTER — Encounter: Payer: 59 | Admitting: Advanced Practice Midwife

## 2021-03-05 DIAGNOSIS — Z30017 Encounter for initial prescription of implantable subdermal contraceptive: Secondary | ICD-10-CM

## 2021-03-05 LAB — CBC
HCT: 33.6 % — ABNORMAL LOW (ref 36.0–46.0)
Hemoglobin: 11.1 g/dL — ABNORMAL LOW (ref 12.0–15.0)
MCH: 25.5 pg — ABNORMAL LOW (ref 26.0–34.0)
MCHC: 33 g/dL (ref 30.0–36.0)
MCV: 77.2 fL — ABNORMAL LOW (ref 80.0–100.0)
Platelets: 190 10*3/uL (ref 150–400)
RBC: 4.35 MIL/uL (ref 3.87–5.11)
RDW: 14.4 % (ref 11.5–15.5)
WBC: 10.8 10*3/uL — ABNORMAL HIGH (ref 4.0–10.5)
nRBC: 0 % (ref 0.0–0.2)

## 2021-03-05 MED ORDER — IBUPROFEN 600 MG PO TABS: 600.0000 mg | ORAL_TABLET | Freq: Four times a day (QID) | ORAL | 0 refills | Status: AC

## 2021-03-05 MED ORDER — ETONOGESTREL 68 MG ~~LOC~~ IMPL
68.0000 mg | DRUG_IMPLANT | Freq: Once | SUBCUTANEOUS | Status: AC
  Administered 2021-03-05: 68 mg via SUBCUTANEOUS
  Filled 2021-03-05: qty 1

## 2021-03-05 MED ORDER — ACETAMINOPHEN 325 MG PO TABS: 650.0000 mg | ORAL_TABLET | ORAL | Status: DC | PRN

## 2021-03-05 MED ORDER — LIDOCAINE HCL 1 % IJ SOLN
0.0000 mL | Freq: Once | INTRAMUSCULAR | Status: AC | PRN
  Administered 2021-03-05: 20 mL via INTRADERMAL
  Filled 2021-03-05: qty 20

## 2021-03-05 NOTE — Procedures (Addendum)
Post-Placental Nexplanon Insertion Procedure Note  Patient was identified. Informed consent was signed, signed copy in chart. A time-out was performed.    The insertion site was identified 8-10 cm (3-4 inches) from the medial epicondyle of the humerus and 3-5 cm (1.25-2 inches) posterior to (below) the sulcus (groove) between the biceps and triceps muscles of the patient's left arm and marked. The site was prepped and draped in the usual sterile fashion. Pt was prepped with alcohol swab and then injected with 1.5 cc of 1% lidocaine. The site was prepped with betadine. Nexplanon removed form packaging,  Device confirmed in needle, then inserted full length of needle and withdrawn per handbook instructions. Provider and patient verified presence of the implant in the womans arm by palpation. Pt insertion site was covered with steristrips/gauze and pressure bandage. There was minimal blood loss. Patient tolerated procedure well.  Patient was given post procedure instructions and Nexplanon user card with expiration date. Patient was asked to keep the pressure dressing on for 24 hours to minimize bruising and keep the adhesive bandage on for 3-5 days. The patient verbalized understanding of the plan of care and agrees.   Lot # [id number] Expiration Date 12/26/2022   GME ATTESTATION:  I saw and evaluated the patient. I agree with the findings and the plan of care as documented in the residents note. I was present and gloved and supervised entire procedure.  Warner Mccreedy, MD, MPH OB Fellow, Faculty Practice [facility], Center for [facility] 03/05/2021 1:37 PM   Darletta Moll, MD 03/05/2021 1225 PM

## 2021-03-05 NOTE — Progress Notes (Addendum)
Post Partum Day 1 Subjective: Patient reports that she has been up ad lib, voiding, tolerating PO, + flatus, and has had minimal lochia. States pain is primarily abdominal cramping worse with breastfeeding, but has been tolerable with PO medications. Does endorse some numbness/tingling in her right thigh but has been able to ambulate without difficulty. Is considering discharging home this evening when baby is 24 hrs and cleared by Peds vs staying overnight to have BTL tomorrow.   Objective: Blood pressure 114/72, pulse 83, temperature 98.3 F (36.8 C), temperature source Oral, resp. rate 18, height 5\' 2"  (1.575 m), weight 100.5 kg, last menstrual period 06/03/2020, SpO2 100 %, unknown if currently breastfeeding.  Physical Exam:  General: alert, cooperative, appears stated age, and no distress Lochia: appropriate Uterine Fundus: firm Incision: N/A DVT Evaluation: No evidence of DVT seen on physical exam. Negative Homan's sign. No cords or calf tenderness. No significant calf/ankle edema.  Recent Labs    03/04/21 0558 03/05/21 0423  HGB 11.6* 11.1*  HCT 35.6* 33.6*    Assessment/Plan: 26 year old G13P2 PPD#1 from NSVD.   #S/p NSVD: Plan for discharge tomorrow pending patient decision regarding contraception, Breastfeeding, and desires BTL/Nexplanon. Nursing to notify if patient desires d/c vs BTL tomorrow.    LOS: 1 day   08-25-1980 03/05/2021, 9:05 AM   I spoke with and examined patient and agree with resident/PA-S/MS/SNM's note and plan of care.  05/03/2021, CNM, [facility] 03/10/2021 12:13 PM

## 2021-03-05 NOTE — Anesthesia Postprocedure Evaluation (Signed)
Anesthesia Post Note  Patient: Michelle Stephenson  Procedure(s) Performed: AN AD HOC LABOR EPIDURAL     Patient location during evaluation: Mother Baby Anesthesia Type: Epidural Level of consciousness: awake and alert, oriented and patient cooperative Pain management: pain level controlled Vital Signs Assessment: post-procedure vital signs reviewed and stable Respiratory status: spontaneous breathing Cardiovascular status: stable Postop Assessment: no headache, epidural receding, patient able to bend at knees and no signs of nausea or vomiting Anesthetic complications: no   No notable events documented.  Last Vitals:  Vitals:   03/05/21 0200 03/05/21 0446  BP: (!) 112/58 114/72  Pulse: 86 83  Resp: 18 18  Temp: 36.7 C 36.8 C  SpO2: 100% 100%    Last Pain:  Vitals:   03/05/21 0446  TempSrc: Oral  PainSc:    Pain Goal:                   [facility]

## 2021-03-05 NOTE — Clinical Social Work Maternal (Signed)
°CLINICAL SOCIAL WORK MATERNAL/CHILD NOTE ° °Patient Details  °Name: Michelle Stephenson °MRN: [id number] °Date of Birth: 07/03/1995 ° °Date:  03/05/2021 ° °Clinical Social Worker Initiating Note:  Delene Morais, LCSW Date/Time: Initiated:  03/05/21/1050    ° °Child's Name:  Michelle Stephenson  ° °Biological Parents:  Mother (Harman Brenning 05/18/1995)  ° °Need for Interpreter:  None  ° °Reason for Referral:  Current Substance Use/Substance Use During Pregnancy  , Behavioral Health Concerns  ° °Address:  [address] °[place] [address]  °  °Phone number:  [phone] (home)    ° °Additional phone number:  ° °Household Members/Support Persons (HM/SP):   Household Member/Support Person 1, Household Member/Support Person 2 ° ° °HM/SP Name Relationship DOB or Age  °HM/SP -1 Theresa Barr Biological Mother 05-11-1975  °HM/SP -2 Kai Garske Daughter 14 months  °HM/SP -3        °HM/SP -4        °HM/SP -5        °HM/SP -6        °HM/SP -7        °HM/SP -8        ° ° °Natural Supports (not living in the home):  Parent  ° °Professional Supports: Therapist  ° °Employment: Part-time  ° °Type of Work: [employer]  ° °Education:  Some College  ° °Homebound arranged: No ° °Financial Resources:  Private Insurance , Medicaid  ° °Other Resources:  WIC, Food Stamps    ° °Cultural/Religious Considerations Which May Impact Care:   ° °Strengths:  Ability to meet basic needs  , Home prepared for child  , Pediatrician chosen  ° °Psychotropic Medications:        ° °Pediatrician:    [place] ° °Pediatrician List:  ° °[place]    °[place]    °[place]    °[place] [facility]  °[place]    °[place]    ° ° °Pediatrician Fax Number:   ° °Risk Factors/Current Problems:  Substance Use    ° °Cognitive State:  Alert  , Linear Thinking  , Able to Concentrate    ° °Mood/Affect:  Calm  , Comfortable    ° °CSW Assessment: : CSW received consult for hx of THC use, Anxiety, Depression, PTSD. CSW also noted  that MOB has hx of bipolar disorder. CSW met with MOB to offer support and complete assessment.   ° °CSW met with MOB at bedside and introduced CSW role. CSW observed MOB sitting up in bed and her supports at bedside. CSW offered MOB privacy and her supports left to allow privacy. MOB presented calm and receptive to CSW visit. MOB confirmed that the demographic information on file is correct. MOB reported she is living with her biological mom and daughter at this time (see chart above). MOB reported she did not want to share FOB MOB reported she is employed at [employer] part time and receives WIC/FS benefits. MOB identified her adoptive mom, Nabre Palo as a support.  ° °MOB openly disclosed that she accidentally ate her brother's THC edible sugar cookie during the Thanksgiving holiday, she did not know they were edibles. MOB reported she smoked prior to learning about her pregnancy but did not smoke during the pregnancy. CSW informed MOB about the hospital drug screen policy. MOB made aware that CSW will monitor the infant's UDS/CDS and make a report, if warranted. MOB reported CPS history in 2021 with [place] because her older   daughter's umbilical cord resulted positive for THC. MOB reported the CPS case is closed.  ° °CSW asked MOB about her mental health history. MOB acknowledged that she has a history of Bipolar II with depressive symptoms, anxiety and PTSD. MOB reported she was diagnosed many years ago and has received therapy over the years. MOB reported she has planned to see a therapist postpartum. MOB shared that her adoptive mom is a social worker/therapist, so she has access to therapy resources and support when needed. CSW inquired about MOB coping strategies. MOB reported that she takes 10 minutes every morning to meditate, she practices deep breathing and writes. CSW encouraged MOB to continue implementing the coping skills. MOB reported no history of PPD however she was receptive to PPD  resources.  °CSW provided education regarding the baby blues period vs. perinatal mood disorders, discussed treatment and gave resources for mental health follow up if concerns arise. CSW recommended MOB complete self-evaluation during the postpartum time period using the New Mom Checklist from Postpartum Progress and encouraged MOB to contact a medical professional if symptoms are noted at any time. MOB denied SI/HI/DV.  ° °MOB reported she has essential items for the infant including a crib where the infant will sleep. CSW provided review of Sudden Infant Death Syndrome (SIDS) precautions. MOB has chosen [facility] for the infant's follow up care and will have transportation to the appointments. CSW assessed MOB for additional needs. MOB reported no further need.  ° °CSW identifies no further need for intervention and no barriers to discharge at this time.  ° °CSW Plan/Description:  Sudden Infant Death Syndrome (SIDS) Education, CSW Will Continue to Monitor Umbilical Cord Tissue Drug Screen Results and Make Report if Warranted, Hospital Drug Screen Policy Information, Perinatal Mood and Anxiety Disorder (PMADs) Education, No Further Intervention Required/No Barriers to Discharge  ° ° °Alexandre Lightsey A Dezi Schaner, LCSW °03/05/2021, 11:58 AM °

## 2021-03-05 NOTE — Progress Notes (Incomplete)
Post Partum Day 1 Subjective: Eating, drinking, voiding, ambulating well.  +flatus.  Lochia and pain wnl.  Denies dizziness, lightheadedness, or sob. Cramping. Not sure if wants to go home later this evening or stay tomorrow to have BTL, will decide today and let her nurse know.   Objective: Blood pressure (!) 112/58, pulse 86, temperature 98 F (36.7 C), temperature source Oral, resp. rate 18, height 5\' 2"  (1.575 m), weight 100.5 kg, last menstrual period 06/03/2020, SpO2 100 %, unknown if currently breastfeeding.  Physical Exam:  General: alert, cooperative, and no distress Lochia: appropriate Uterine Fundus: firm Incision: n/a DVT Evaluation: No evidence of DVT seen on physical exam. Negative Homan's sign. No cords or calf tenderness. No significant calf/ankle edema.  Recent Labs    03/04/21 0558 03/05/21 0423  HGB 11.6* 11.1*  HCT 35.6* 33.6*    Assessment/Plan: Breastfeeding, deciding if she wants to go home after PKU this evening, or stay for BTL tomorrow, to let nurses know so we can plan accordingly   LOS: 1 day   05/03/21 03/05/2021, 7:29 AM

## 2021-03-12 ENCOUNTER — Encounter (HOSPITAL_COMMUNITY): Payer: Self-pay

## 2021-03-12 ENCOUNTER — Other Ambulatory Visit: Payer: Self-pay

## 2021-03-12 ENCOUNTER — Emergency Department (HOSPITAL_COMMUNITY)
Admission: EM | Admit: 2021-03-12 | Discharge: 2021-03-12 | Disposition: A | Discharge status: Home / Self Care | Payer: 59 | Attending: Emergency Medicine | Admitting: Emergency Medicine

## 2021-03-12 DIAGNOSIS — N814 Uterovaginal prolapse, unspecified: Secondary | ICD-10-CM | POA: Diagnosis present

## 2021-03-12 DIAGNOSIS — Z91040 Latex allergy status: Secondary | ICD-10-CM | POA: Insufficient documentation

## 2021-03-12 DIAGNOSIS — N8110 Cystocele, unspecified: Secondary | ICD-10-CM

## 2021-03-12 DIAGNOSIS — K5909 Other constipation: Secondary | ICD-10-CM

## 2021-03-12 NOTE — Discharge Instructions (Addendum)
It was our pleasure to provide your ER care today - we hope that you feel better.  If constipated, make sure to drink plenty of fluids/stay well hydrated, drink plenty of fluids, get adequate fiber in diet. You may also take colace (stool softener) 2x/day and miralax (laxative) once per day as need - available over the counter.   For vaginal issue and post delivery care, follow up closely with your ob/gyn doctor in the next 1-2  weeks.   Return to ER if worse, new symptoms, new/severe pain, fevers, or other concern.

## 2021-03-12 NOTE — ED Provider Notes (Signed)
[facility] EMERGENCY DEPARTMENT Provider Note   CSN: [id number] Arrival date & time: 03/12/21  1849     History  Chief Complaint  Patient presents with   Vaginal Prolapse    Michelle Stephenson is a 26 y.o. female.  Patient indicates is 8 days s/p NSVD at term. States no complications then. Mild vaginal bleeding has been tapering off, minimal bleeding today. Today noticed something that felt like 'sac' protruding from vaginal area - she indicates put it back in.  Patient denies hx same. No abd or pelvic pain. States since delivery has been straining to have bm. Small bm today. No abd distension or nausea/vomiting. No dysuria. No back/flank pain. No fever or chills.   The history is provided by the patient and medical records.      Home Medications Prior to Admission medications   Medication Sig Start Date End Date Taking? Authorizing Provider  acetaminophen (TYLENOL) 325 MG tablet Take 2 tablets (650 mg total) by mouth every 4 (four) hours as needed (for pain scale < 4). 03/05/21   Raylene Everts, MD  acyclovir (ZOVIRAX) 400 MG tablet Take 1 tablet (400 mg total) by mouth 3 (three) times daily. 02/03/21   Cheral Marker, CNM  ibuprofen (ADVIL) 600 MG tablet Take 1 tablet (600 mg total) by mouth every 6 (six) hours. 03/05/21   Raylene Everts, MD  prenatal vitamin w/FE, FA (NATACHEW) 29-1 MG CHEW chewable tablet Chew 1 tablet by mouth daily at 12 noon.    [provider]      Allergies    Latex    Review of Systems   Review of Systems  Constitutional:  Negative for chills and fever.  HENT:  Negative for sore throat.   Eyes:  Negative for redness.  Respiratory:  Negative for shortness of breath.   Cardiovascular:  Negative for chest pain.  Gastrointestinal:  Negative for abdominal pain and vomiting.  Genitourinary:  Negative for flank pain.  Musculoskeletal:  Negative for back pain.  Skin:  Negative for rash.  Neurological:  Negative for headaches.   Hematological:  Does not bruise/bleed easily.  Psychiatric/Behavioral:  Negative for confusion.    Physical Exam Updated Vital Signs BP 116/81 (BP Location: Right Arm)    Pulse 72    Temp 98.6 F (37 C) (Oral)    Resp 18    Ht 1.575 m (5\' 2" )    Wt 94.2 kg    SpO2 100%    BMI 37.97 kg/m  Physical Exam Vitals and nursing note reviewed.  Constitutional:      Appearance: Normal appearance. She is well-developed.  HENT:     Head: Atraumatic.     Nose: Nose normal.     Mouth/Throat:     Mouth: Mucous membranes are moist.  Eyes:     General: No scleral icterus.    Conjunctiva/sclera: Conjunctivae normal.  Neck:     Trachea: No tracheal deviation.  Cardiovascular:     Rate and Rhythm: Normal rate.     Pulses: Normal pulses.  Pulmonary:     Effort: Pulmonary effort is normal. No respiratory distress.  Abdominal:     General: There is no distension.     Palpations: Abdomen is soft.     Tenderness: There is no abdominal tenderness.  Genitourinary:    Comments: No cva tenderness. On pelvic exam (rn chaperoned), there is no prolapse out of vagina. Cervix in normal position. Scant amount of blood in vaginal. No purulent  discharge. Appears to have some laxity/mild prolapse vaginal wall.  Musculoskeletal:        General: No swelling.     Cervical back: Neck supple. No muscular tenderness.  Skin:    General: Skin is warm and dry.     Findings: No rash.  Neurological:     Mental Status: She is alert.     Comments: Alert, speech normal.   Psychiatric:        Mood and Affect: Mood normal.    ED Results / Procedures / Treatments   Labs (all labs ordered are listed, but only abnormal results are displayed) Labs Reviewed - No data to display  EKG None  Radiology No results found.  Procedures Procedures    Medications Ordered in ED Medications - No data to display  ED Course/ Medical Decision Making/ A&P                           Medical Decision Making Problems  Addressed: Other constipation: acute illness or injury Vaginal wall prolapse: acute illness or injury  Risk OTC drugs.   Pelvic exam, RN chaperoning.   Reviewed nursing notes and prior charts for additional history. External reports reviewed.   Discussed exam w pt.   Pt indicates her OB/GYN is [facility] - pt currently appears stable for d/c, and is encouraged to follow up there.   Return precautions provided.         Final Clinical Impression(s) / ED Diagnoses Final diagnoses:  None    Rx / DC Orders ED Discharge Orders     None         Cathren Laine, MD 03/12/21 2027

## 2021-03-12 NOTE — ED Triage Notes (Addendum)
Pt presents with vaginal pain and spotting that started last Thursday. Pt states that when she breast feeds the pain is intense and sharp. Pain is intermittent.  Pt also states that she noticed something "like a sac or hernia" coming from her vagina today and states that she "pushed it back.in".

## 2021-03-14 ENCOUNTER — Encounter: Payer: Self-pay | Admitting: Women's Health

## 2021-03-18 ENCOUNTER — Ambulatory Visit (INDEPENDENT_AMBULATORY_CARE_PROVIDER_SITE_OTHER): Payer: 59 | Admitting: Women's Health

## 2021-03-18 ENCOUNTER — Other Ambulatory Visit: Payer: Self-pay

## 2021-03-18 ENCOUNTER — Telehealth (HOSPITAL_COMMUNITY): Payer: Self-pay | Admitting: *Deleted

## 2021-03-18 ENCOUNTER — Encounter: Payer: Self-pay | Admitting: Women's Health

## 2021-03-18 DIAGNOSIS — Z0000 Encounter for general adult medical examination without abnormal findings: Secondary | ICD-10-CM | POA: Diagnosis not present

## 2021-03-18 NOTE — Progress Notes (Signed)
° °  GYN VISIT Patient name: Michelle Stephenson MRN [id number]  Date of birth: 1995-08-26 Chief Complaint:   Follow-up (Seen in ED for possible prolapse. Recent delivery)  History of Present Illness:   Michelle Stephenson is a 26 y.o. [id number] African-American female 2wks s/p SVB, being seen today for possible uterine prolapse, went to ED 1/18 b/c she felt something coming out. Per EDP note, normal exam. Feels like she has to push it back in at times. Breastfeeding. Has Nexplanon, but also wants BTL.   Wants work note to go back 2/4, has to financially-note given.   No LMP recorded. (Menstrual status: Lactating).  Depression screen [facility] 2/9 02/03/2021 07/27/2017  Decreased Interest 1 3  Down, Depressed, Hopeless 1 3  PHQ - 2 Score 2 6  Altered sleeping 3 3  Tired, decreased energy 1 0  Change in appetite 1 3  Feeling bad or failure about yourself  2 0  Trouble concentrating 1 0  Moving slowly or fidgety/restless 1 0  Suicidal thoughts 0 0  PHQ-9 Score 11 12  Difficult doing work/chores - Somewhat difficult     GAD 7 : Generalized Anxiety Score 02/03/2021  Nervous, Anxious, on Edge 1  Control/stop worrying 1  Worry too much - different things 3  Trouble relaxing 1  Restless 1  Easily annoyed or irritable 2  Afraid - awful might happen 2  Total GAD 7 Score 11     Review of Systems:   Pertinent items are noted in HPI Denies fever/chills, dizziness, headaches, visual disturbances, fatigue, shortness of breath, chest pain, abdominal pain, vomiting, abnormal vaginal discharge/itching/odor/irritation, problems with periods, bowel movements, urination, or intercourse unless otherwise stated above.  Pertinent History Reviewed:  Reviewed past medical,surgical, social, obstetrical and family history.  Reviewed problem list, medications and allergies. Physical Assessment:   Vitals:   03/18/21 0858  BP: 130/82  Pulse: 90  Weight: 201 lb (91.2 kg)  Height: 5\' 1"  (1.549 m)  Body mass index is  37.98 kg/m.       Physical Examination:   General appearance: alert, well appearing, and in no distress  Mental status: alert, oriented to person, place, and time  Skin: warm & dry   Cardiovascular: normal heart rate noted  Respiratory: normal respiratory effort, no distress  Abdomen: soft, non-tender   Pelvic: no obvious prolapse on external exam, A&P vaginal walls protrude some w/ valsalva maneuver. On internal exam, cx/uterus in normal position, no prolapse  Extremities: no edema   Chaperone:    No results found for this or any previous visit (from the past 24 hour(s)).  Assessment & Plan:  1) 2wks s/p SVB> breastfeeding, has Nexplanon, wants BTL  2) No uterine prolapse noted> some A&P vaginal wall protrusion w/ valsalva  Meds: No orders of the defined types were placed in this encounter.   No orders of the defined types were placed in this encounter.   Return for As scheduled.  Faith Rogue CNM, [facility] 03/18/2021 9:36 AM

## 2021-03-24 ENCOUNTER — Encounter: Payer: 59 | Admitting: Obstetrics & Gynecology

## 2021-04-09 ENCOUNTER — Ambulatory Visit: Payer: 59 | Admitting: Women's Health

## 2021-04-10 ENCOUNTER — Encounter: Payer: Self-pay | Admitting: Obstetrics & Gynecology

## 2021-04-10 ENCOUNTER — Other Ambulatory Visit: Payer: Self-pay

## 2021-04-10 ENCOUNTER — Ambulatory Visit (INDEPENDENT_AMBULATORY_CARE_PROVIDER_SITE_OTHER): Payer: 59 | Admitting: Obstetrics & Gynecology

## 2021-04-10 VITALS — BP 96/68 | HR 71 | Ht 61.0 in | Wt 198.0 lb

## 2021-04-10 DIAGNOSIS — Z3009 Encounter for other general counseling and advice on contraception: Secondary | ICD-10-CM | POA: Diagnosis not present

## 2021-04-10 NOTE — Progress Notes (Signed)
Chief Complaint  Patient presents with   Pre-op Exam    For BTL      26 y.o. [id number] No LMP recorded. (Menstrual status: Lactating). The current method of family planning is nexplanon.  Outpatient Encounter Medications as of 04/10/2021  Medication Sig   acetaminophen (TYLENOL) 325 MG tablet Take 2 tablets (650 mg total) by mouth every 4 (four) hours as needed (for pain scale < 4). (Patient not taking: Reported on 03/18/2021)   acyclovir (ZOVIRAX) 400 MG tablet Take 1 tablet (400 mg total) by mouth 3 (three) times daily. (Patient not taking: Reported on 03/18/2021)   ibuprofen (ADVIL) 600 MG tablet Take 1 tablet (600 mg total) by mouth every 6 (six) hours.   prenatal vitamin w/FE, FA (NATACHEW) 29-1 MG CHEW chewable tablet Chew 1 tablet by mouth daily at 12 noon. (Patient not taking: Reported on 03/18/2021)   No facility-administered encounter medications on file as of 04/10/2021.    Subjective Pt on nexplanon, her periods are shorter and lighter, wants to keep it for cycle management Multiparous female desires  permanent sterilization, opts for salpingectomy All questions answered Past Medical History:  Diagnosis Date   Anxiety    Bipolar 1 disorder (HCC)    Depression    HSV (herpes simplex virus) infection 07/27/2017   left mons   Mental disorder    depression; PTSD   Sickle cell trait (HCC)     Past Surgical History:  Procedure Laterality Date   CHOLECYSTECTOMY     TONSILLECTOMY AND ADENOIDECTOMY      OB History     Gravida  13   Para  2   Term  2   Preterm      AB  11   Living  2      SAB  11   IAB      Ectopic      Multiple  0   Live Births  2           Allergies  Allergen Reactions   Latex Itching    Social History   Socioeconomic History   Marital status: Single    Spouse name: Not on file   Number of children: 0   Years of education: Not on file   Highest education level: Not on file  Occupational History    Occupation: Unemployed  Tobacco Use   Smoking status: Former    Packs/day: 0.25    Years: 1.00    Pack years: 0.25    Types: Cigarettes   Smokeless tobacco: Never  Vaping Use   Vaping Use: Never used  Substance and Sexual Activity   Alcohol use: Yes   Drug use: Not Currently    Types: Marijuana    Comment: 01/18/2021 last used marj.   Sexual activity: Yes    Birth control/protection: None  Other Topics Concern   Not on file  Social History Narrative   Not on file   Social Determinants of Health   Financial Resource Strain: Medium Risk   Difficulty of Paying Living Expenses: Somewhat hard  Food Insecurity: Food Insecurity Present   Worried About Programme researcher, broadcasting/film/video in the Last Year: Sometimes true   Ran Out of Food in the Last Year: Sometimes true  Transportation Needs: Unmet Transportation Needs   Lack of Transportation (Medical): No   Lack of Transportation (Non-Medical): Yes  Physical Activity: Insufficiently Active   Days of Exercise per Week: 3 days   Minutes  of Exercise per Session: 20 min  Stress: Stress Concern Present   Feeling of Stress : Very much  Social Connections: Socially Isolated   Frequency of Communication with Friends and Family: Twice a week   Frequency of Social Gatherings with Friends and Family: Once a week   Attends Religious Services: Never   Database administrator or Organizations: No   Attends Engineer, structural: Never   Marital Status: Never married    Family History  Problem Relation Age of Onset   Diabetes Maternal Grandmother    Hypertension Maternal Grandmother     Medications:       Current Outpatient Medications:    acetaminophen (TYLENOL) 325 MG tablet, Take 2 tablets (650 mg total) by mouth every 4 (four) hours as needed (for pain scale < 4). (Patient not taking: Reported on 03/18/2021), Disp: , Rfl:    acyclovir (ZOVIRAX) 400 MG tablet, Take 1 tablet (400 mg total) by mouth 3 (three) times daily. (Patient not  taking: Reported on 03/18/2021), Disp: 90 tablet, Rfl: 3   ibuprofen (ADVIL) 600 MG tablet, Take 1 tablet (600 mg total) by mouth every 6 (six) hours., Disp: 30 tablet, Rfl: 0   prenatal vitamin w/FE, FA (NATACHEW) 29-1 MG CHEW chewable tablet, Chew 1 tablet by mouth daily at 12 noon. (Patient not taking: Reported on 03/18/2021), Disp: , Rfl:   Objective Blood pressure 96/68, pulse 71, height 5\' 1"  (1.549 m), weight 198 lb (89.8 kg), currently breastfeeding.  Gen WDWN NAD  Pertinent ROS No burning with urination, frequency or urgency No nausea, vomiting or diarrhea Nor fever chills or other constitutional symptoms   Labs or studies     Impression Diagnoses this Encounter::   ICD-10-CM   1. Encounter for consultation for female sterilization  Z30.09    Planned 04/23/21 [facility]      Established relevant diagnosis(es):   Plan/Recommendations: No orders of the defined types were placed in this encounter.   Labs or Scans Ordered: No orders of the defined types were placed in this encounter.   Management:: As above   Follow up Return in about 22 days (around 05/02/2021) for 07/02/2021 visit, Post Op, with Dr [employer].      All questions were answered.

## 2021-04-17 NOTE — Patient Instructions (Signed)
Your procedure is scheduled on: 04/23/2021  Report to [facility] Entrance at   6:15  AM.  Call this number if you have problems the morning of surgery: [phone]   Remember:   Do not Eat or Drink after midnight         No Smoking the morning of surgery  :  Take these medicines the morning of surgery with A SIP OF WATER: none   Do not wear jewelry, make-up or nail polish.  Do not wear lotions, powders, or perfumes. You may wear deodorant.  Do not shave 48 hours prior to surgery. Men may shave face and neck.  Do not bring valuables to the hospital.  Contacts, dentures or bridgework may not be worn into surgery.  Leave suitcase in the car. After surgery it may be brought to your room.  For patients admitted to the hospital, checkout time is 11:00 AM the day of discharge.   Patients discharged the day of surgery will not be allowed to drive home.    Special Instructions: Shower using CHG night before surgery and shower the day of surgery use CHG.  Use special wash - you have one bottle of CHG for all showers.  You should use approximately 1/2 of the bottle for each shower.  How to Use Chlorhexidine for Bathing Chlorhexidine gluconate (CHG) is a germ-killing (antiseptic) solution that is used to clean the skin. It can get rid of the bacteria that normally live on the skin and can keep them away for about 24 hours. To clean your skin with CHG, you may be given: A CHG solution to use in the shower or as part of a sponge bath. A prepackaged cloth that contains CHG. Cleaning your skin with CHG may help lower the risk for infection: While you are staying in the intensive care unit of the hospital. If you have a vascular access, such as a central line, to provide short-term or long-term access to your veins. If you have a catheter to drain urine from your bladder. If you are on a ventilator. A ventilator is a machine that helps you breathe by moving air in and out of your lungs. After  surgery. What are the risks? Risks of using CHG include: A skin reaction. Hearing loss, if CHG gets in your ears and you have a perforated eardrum. Eye injury, if CHG gets in your eyes and is not rinsed out. The CHG product catching fire. Make sure that you avoid smoking and flames after applying CHG to your skin. Do not use CHG: If you have a chlorhexidine allergy or have previously reacted to chlorhexidine. On babies younger than 1002 months of age. How to use CHG solution Use CHG only as told by your health care provider, and follow the instructions on the label. Use the full amount of CHG as directed. Usually, this is one bottle. During a shower Follow these steps when using CHG solution during a shower (unless your health care provider gives you different instructions): Start the shower. Use your normal soap and shampoo to wash your face and hair. Turn off the shower or move out of the shower stream. Pour the CHG onto a clean washcloth. Do not use any type of brush or rough-edged sponge. Starting at your neck, lather your body down to your toes. Make sure you follow these instructions: If you will be having surgery, pay special attention to the part of your body where you will be having surgery. Scrub  this area for at least 1 minute. Do not use CHG on your head or face. If the solution gets into your ears or eyes, rinse them well with water. Avoid your genital area. Avoid any areas of skin that have broken skin, cuts, or scrapes. Scrub your back and under your arms. Make sure to wash skin folds. Let the lather sit on your skin for 1-2 minutes or as long as told by your health care provider. Thoroughly rinse your entire body in the shower. Make sure that all body creases and crevices are rinsed well. Dry off with a clean towel. Do not put any substances on your body afterward--such as powder, lotion, or perfume--unless you are told to do so by your health care provider. Only use lotions  that are recommended by the manufacturer. Put on clean clothes or pajamas. If it is the night before your surgery, sleep in clean sheets.  During a sponge bath Follow these steps when using CHG solution during a sponge bath (unless your health care provider gives you different instructions): Use your normal soap and shampoo to wash your face and hair. Pour the CHG onto a clean washcloth. Starting at your neck, lather your body down to your toes. Make sure you follow these instructions: If you will be having surgery, pay special attention to the part of your body where you will be having surgery. Scrub this area for at least 1 minute. Do not use CHG on your head or face. If the solution gets into your ears or eyes, rinse them well with water. Avoid your genital area. Avoid any areas of skin that have broken skin, cuts, or scrapes. Scrub your back and under your arms. Make sure to wash skin folds. Let the lather sit on your skin for 1-2 minutes or as long as told by your health care provider. Using a different clean, wet washcloth, thoroughly rinse your entire body. Make sure that all body creases and crevices are rinsed well. Dry off with a clean towel. Do not put any substances on your body afterward--such as powder, lotion, or perfume--unless you are told to do so by your health care provider. Only use lotions that are recommended by the manufacturer. Put on clean clothes or pajamas. If it is the night before your surgery, sleep in clean sheets. How to use CHG prepackaged cloths Only use CHG cloths as told by your health care provider, and follow the instructions on the label. Use the CHG cloth on clean, dry skin. Do not use the CHG cloth on your head or face unless your health care provider tells you to. When washing with the CHG cloth: Avoid your genital area. Avoid any areas of skin that have broken skin, cuts, or scrapes. Before surgery Follow these steps when using a CHG cloth to  clean before surgery (unless your health care provider gives you different instructions): Using the CHG cloth, vigorously scrub the part of your body where you will be having surgery. Scrub using a back-and-forth motion for 3 minutes. The area on your body should be completely wet with CHG when you are done scrubbing. Do not rinse. Discard the cloth and let the area air-dry. Do not put any substances on the area afterward, such as powder, lotion, or perfume. Put on clean clothes or pajamas. If it is the night before your surgery, sleep in clean sheets.  For general bathing Follow these steps when using CHG cloths for general bathing (unless your health care provider  gives you different instructions). Use a separate CHG cloth for each area of your body. Make sure you wash between any folds of skin and between your fingers and toes. Wash your body in the following order, switching to a new cloth after each step: The front of your neck, shoulders, and chest. Both of your arms, under your arms, and your hands. Your stomach and groin area, avoiding the genitals. Your right leg and foot. Your left leg and foot. The back of your neck, your back, and your buttocks. Do not rinse. Discard the cloth and let the area air-dry. Do not put any substances on your body afterward--such as powder, lotion, or perfume--unless you are told to do so by your health care provider. Only use lotions that are recommended by the manufacturer. Put on clean clothes or pajamas. Contact a health care provider if: Your skin gets irritated after scrubbing. You have questions about using your solution or cloth. You swallow any chlorhexidine. Call your local poison control center [phone] [phone] in the [place] Get help right away if: Your eyes itch badly, or they become very red or swollen. Your skin itches badly and is red or swollen. Your hearing changes. You have trouble seeing. You have swelling or tingling in your mouth or  throat. You have trouble breathing. These symptoms may represent a serious problem that is an emergency. Do not wait to see if the symptoms will go away. Get medical help right away. Call your local emergency services (911 in the [place] Do not drive yourself to the hospital. Summary Chlorhexidine gluconate (CHG) is a germ-killing (antiseptic) solution that is used to clean the skin. Cleaning your skin with CHG may help to lower your risk for infection. You may be given CHG to use for bathing. It may be in a bottle or in a prepackaged cloth to use on your skin. Carefully follow your health care provider's instructions and the instructions on the product label. Do not use CHG if you have a chlorhexidine allergy. Contact your health care provider if your skin gets irritated after scrubbing. This information is not intended to replace advice given to you by your health care provider. Make sure you discuss any questions you have with your health care provider. Document Revised: 04/22/2020 Document Reviewed: 04/22/2020 Elsevier Patient Education  2022 [employer] Salpingectomy, Care After The following information offers guidance on how to care for yourself after your procedure. Your health care provider may also give you more specific instructions. If you have problems or questions, contact your health care provider. What can I expect after the procedure? After the procedure, it is common to have: Pain in your abdomen. Light vaginal bleeding (spotting) for a few days. Tiredness. Your recovery time will depend on which method was used for your surgery. Follow these instructions at home: Medicines Take over-the-counter and prescription medicines only as told by your health care provider. Ask your health care provider if the medicine prescribed to you: Requires you to avoid driving or using machinery. Can cause constipation. You may need to take actions to prevent or treat constipation, such  as: Drink enough fluid to keep your urine pale yellow. Take over-the-counter or prescription medicines. Eat foods that are high in fiber, such as beans, whole grains, and fresh fruits and vegetables. Limit foods that are high in fat and processed sugars, such as fried or sweet foods. Incision care  Follow instructions from your health care provider about how to take care of your incision or  incisions. Make sure you: Wash your hands with soap and water for at least 20 seconds before and after you change your bandage (dressing). If soap and water are not available, use hand sanitizer. Change or remove your dressing as told by your health care provider. Leave stitches (sutures), skin glue, staples, or adhesive strips in place. These skin closures may need to stay in place for 2 weeks or longer. If adhesive strip edges start to loosen and curl up, you may trim the loose edges. Do not remove adhesive strips completely unless your health care provider tells you to do that. Keep your dressing clean and dry. Check your incision area every day for signs of infection. Check for: Redness, swelling, or pain that gets worse. Fluid or blood. Warmth. Pus or a bad smell. Activity Rest as told by your health care provider. Avoid sitting for a long time without moving. Get up to take short walks every 1-2 hours. This is important to improve blood flow and breathing. Ask for help if you feel weak or unsteady. Return to your normal activities as told by your health care provider. Ask your health care provider what activities are safe for you. Do not drive until your health care provider says that it is safe. Do not lift anything that is heavier than 10 lb (4.5 kg), or the limit that you are told, until your health care provider says that it is safe. This may last for 2-6 weeks depending on your surgery. Do not douche, use tampons, or have sex until your health care provider approves. General instructions Do not  use any products that contain nicotine or tobacco. These products include cigarettes, chewing tobacco, and vaping devices, such as e-cigarettes. These can delay healing after surgery. If you need help quitting, ask your health care provider. Wear compression stockings as told by your health care provider. These stockings help to prevent blood clots and reduce swelling in your legs. Do not take baths, swim, or use a hot tub until your health care provider approves. You may take showers. Keep all follow-up visits. This is important. Contact a health care provider if: You have pain when you urinate. You have redness, swelling, or more pain around an incision or an incision feels warm to the touch. You have pus, fluid, blood, or a bad smell coming from an incision or an incision starts to open. You have a fever. You have abdominal pain that gets worse or does not get better with medicine. You have a rash. You feel light-headed, have nausea and vomiting, or both. Get help right away if: You have pain in your chest or leg. You develop shortness of breath. You faint. You have increased or heavy vaginal bleeding, such as soaking a sanitary napkin in an hour. These symptoms may represent a serious problem that is an emergency. Do not wait to see if the symptoms will go away. Get medical help right away. Call your local emergency services (911 in the [place] Do not drive yourself to the hospital. Summary After the procedure, it is common to feel tired, have pain in your abdomen, and have light vaginal bleeding for a few days. Follow instructions from your health care provider about how to take care of your incision or incisions. Return to your normal activities as told by your health care provider. Ask your health care provider what activities are safe for you. Do not douche, use tampons, or have sex until your health care provider approves. Keep all  follow-up visits. This is important. This information  is not intended to replace advice given to you by your health care provider. Make sure you discuss any questions you have with your health care provider. Document Revised: 01/02/2020 Document Reviewed: 01/02/2020 Elsevier Patient Education  2022 [employer] General Anesthesia, Adult, Care After This sheet gives you information about how to care for yourself after your procedure. Your health care provider may also give you more specific instructions. If you have problems or questions, contact your health care provider. What can I expect after the procedure? After the procedure, the following side effects are common: Pain or discomfort at the IV site. Nausea. Vomiting. Sore throat. Trouble concentrating. Feeling cold or chills. Feeling weak or tired. Sleepiness and fatigue. Soreness and body aches. These side effects can affect parts of the body that were not involved in surgery. Follow these instructions at home: For the time period you were told by your health care provider:  Rest. Do not participate in activities where you could fall or become injured. Do not drive or use machinery. Do not drink alcohol. Do not take sleeping pills or medicines that cause drowsiness. Do not make important decisions or sign legal documents. Do not take care of children on your own. Eating and drinking Follow any instructions from your health care provider about eating or drinking restrictions. When you feel hungry, start by eating small amounts of foods that are soft and easy to digest (bland), such as toast. Gradually return to your regular diet. Drink enough fluid to keep your urine pale yellow. If you vomit, rehydrate by drinking water, juice, or clear broth. General instructions If you have sleep apnea, surgery and certain medicines can increase your risk for breathing problems. Follow instructions from your health care provider about wearing your sleep device: Anytime you are sleeping,  including during daytime naps. While taking prescription pain medicines, sleeping medicines, or medicines that make you drowsy. Have a responsible adult stay with you for the time you are told. It is important to have someone help care for you until you are awake and alert. Return to your normal activities as told by your health care provider. Ask your health care provider what activities are safe for you. Take over-the-counter and prescription medicines only as told by your health care provider. If you smoke, do not smoke without supervision. Keep all follow-up visits as told by your health care provider. This is important. Contact a health care provider if: You have nausea or vomiting that does not get better with medicine. You cannot eat or drink without vomiting. You have pain that does not get better with medicine. You are unable to pass urine. You develop a skin rash. You have a fever. You have redness around your IV site that gets worse. Get help right away if: You have difficulty breathing. You have chest pain. You have blood in your urine or stool, or you vomit blood. Summary After the procedure, it is common to have a sore throat or nausea. It is also common to feel tired. Have a responsible adult stay with you for the time you are told. It is important to have someone help care for you until you are awake and alert. When you feel hungry, start by eating small amounts of foods that are soft and easy to digest (bland), such as toast. Gradually return to your regular diet. Drink enough fluid to keep your urine pale yellow. Return to your normal activities as told by  your health care provider. Ask your health care provider what activities are safe for you. This information is not intended to replace advice given to you by your health care provider. Make sure you discuss any questions you have with your health care provider. Document Revised: 10/26/2019 Document Reviewed:  05/25/2019 Elsevier Patient Education  2022 [employer].

## 2021-04-18 ENCOUNTER — Other Ambulatory Visit: Payer: Self-pay | Admitting: Obstetrics & Gynecology

## 2021-04-18 DIAGNOSIS — Z01818 Encounter for other preprocedural examination: Secondary | ICD-10-CM

## 2021-04-21 ENCOUNTER — Encounter (HOSPITAL_COMMUNITY): Payer: 59

## 2021-04-21 ENCOUNTER — Encounter (HOSPITAL_COMMUNITY): Payer: Self-pay

## 2021-04-21 ENCOUNTER — Encounter (HOSPITAL_COMMUNITY)
Admission: RE | Admit: 2021-04-21 | Discharge: 2021-04-21 | Disposition: A | Discharge status: Home / Self Care | Payer: 59 | Source: Ambulatory Visit | Attending: Obstetrics & Gynecology | Admitting: Obstetrics & Gynecology

## 2021-04-21 NOTE — Pre-Procedure Instructions (Signed)
[facility] notified that patient was a no show for her pre-op.

## 2021-05-02 ENCOUNTER — Telehealth: Payer: 59 | Admitting: Obstetrics & Gynecology

## 2021-05-25 ENCOUNTER — Other Ambulatory Visit: Payer: Self-pay | Admitting: Obstetrics & Gynecology

## 2021-05-25 DIAGNOSIS — Z01818 Encounter for other preprocedural examination: Secondary | ICD-10-CM

## 2021-05-26 ENCOUNTER — Encounter (HOSPITAL_COMMUNITY): Payer: 59

## 2021-05-27 ENCOUNTER — Encounter (HOSPITAL_COMMUNITY): Payer: Self-pay | Admitting: Anesthesiology

## 2021-05-28 ENCOUNTER — Ambulatory Visit (HOSPITAL_COMMUNITY): Admission: RE | Admit: 2021-05-28 | Payer: 59 | Source: Home / Self Care | Admitting: Obstetrics & Gynecology

## 2021-05-28 ENCOUNTER — Encounter (HOSPITAL_COMMUNITY): Admission: RE | Payer: Self-pay | Source: Home / Self Care

## 2021-05-28 SURGERY — SALPINGECTOMY, BILATERAL, LAPAROSCOPIC
Anesthesia: General | Site: Vagina | Laterality: Bilateral

## 2021-11-13 ENCOUNTER — Ambulatory Visit
Admission: EM | Admit: 2021-11-13 | Discharge: 2021-11-13 | Disposition: A | Discharge status: Home / Self Care | Payer: Medicaid Other

## 2021-11-13 DIAGNOSIS — L03317 Cellulitis of buttock: Secondary | ICD-10-CM | POA: Diagnosis not present

## 2021-11-13 MED ORDER — DOXYCYCLINE HYCLATE 100 MG PO TABS: 100.0000 mg | ORAL_TABLET | Freq: Two times a day (BID) | ORAL | 0 refills | Status: AC

## 2021-11-13 MED ORDER — DEXAMETHASONE SODIUM PHOSPHATE 10 MG/ML IJ SOLN
10.0000 mg | INTRAMUSCULAR | Status: AC
  Administered 2021-11-13: 10 mg via INTRAMUSCULAR

## 2021-11-13 MED ORDER — PREDNISONE 20 MG PO TABS: 40.0000 mg | ORAL_TABLET | Freq: Every day | ORAL | 0 refills | Status: AC

## 2021-11-13 NOTE — ED Provider Notes (Signed)
[facility]    CSN: [id number] Arrival date & time: 11/13/21  1143      History   Chief Complaint Chief Complaint  Patient presents with   Insect Bite    HPI Michelle Stephenson is a 26 y.o. female.   The history is provided by the patient.   Patient presents for an insect bite to the left buttocks that occurred 1 day ago.  Patient states over the past 24 hours, the area has become itchy, more red, and more swollen.  Patient states that she is unsure of what may have bit her.  She states that she has been using hot compresses to the area.  She has not taken any medication.  She states that she does have a headache.  She denies fever, chills, chest pain, abdominal pain, nausea, vomiting, or diarrhea.  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   left mons   Mental disorder    depression; PTSD   Sickle cell trait [facility])     Patient Active Problem List   Diagnosis Date Noted   Unstable fetal lie 03/04/2021   Marijuana use 02/04/2021   Encounter for supervision of normal pregnancy, antepartum 02/03/2021   Late prenatal care in second trimester 02/03/2021    Past Surgical History:  Procedure Laterality Date   CHOLECYSTECTOMY     TONSILLECTOMY AND ADENOIDECTOMY      OB History     Gravida  13   Para  2   Term  2   Preterm      AB  11   Living  2      SAB  11   IAB      Ectopic      Multiple  0   Live Births  2            Home Medications    Prior to Admission medications   Medication Sig Start Date End Date Taking? Authorizing Provider  doxycycline (VIBRA-TABS) 100 MG tablet Take 1 tablet (100 mg total) by mouth 2 (two) times daily for 7 days. 11/13/21 11/20/21 Yes Dontai Pember-Warren, Alda Lea, NP  etonogestrel (NEXPLANON) 68 MG IMPL implant 1 each by Subdermal route once.   Yes [provider]  predniSONE (DELTASONE) 20 MG tablet  Take 2 tablets (40 mg total) by mouth daily with breakfast for 5 days. 11/13/21 11/18/21 Yes Myrick Mcnairy-Warren, Alda Lea, NP  acetaminophen (TYLENOL) 325 MG tablet Take 2 tablets (650 mg total) by mouth every 4 (four) hours as needed (for pain scale < 4). Patient not taking: Reported on 03/18/2021 03/05/21   Fabiola Backer, MD  acyclovir (ZOVIRAX) 400 MG tablet Take 1 tablet (400 mg total) by mouth 3 (three) times daily. Patient not taking: Reported on 03/18/2021 02/03/21   Roma Schanz, CNM  Benzoyl Peroxide 10 % CREA Apply 1 application topically daily as needed (boils).    [provider]  Ferrous Gluconate-C-Folic Acid (IRON-C PO) Take 1 tablet by mouth daily.    [provider]  ibuprofen (ADVIL) 600 MG tablet Take 1 tablet (600 mg total) by mouth every 6 (six) hours. Patient taking differently: Take 600 mg by mouth every 6 (six) hours as needed for moderate pain. 03/05/21   Fabiola Backer, MD    Family History Family History  Problem Relation Age of Onset   Diabetes  Maternal Grandmother    Hypertension Maternal Grandmother     Social History Social History   Tobacco Use   Smoking status: Former    Packs/day: 0.25    Years: 1.00    Total pack years: 0.25    Types: Cigarettes   Smokeless tobacco: Never  Vaping Use   Vaping Use: Never used  Substance Use Topics   Alcohol use: Yes   Drug use: Not Currently    Types: Marijuana    Comment: 01/18/2021 last used marj.     Allergies   Latex   Review of Systems Review of Systems Per HPI  Physical Exam Triage Vital Signs ED Triage Vitals  Enc Vitals Group     BP 11/13/21 1213 102/71     Pulse Rate 11/13/21 1213 71     Resp 11/13/21 1213 16     Temp 11/13/21 1213 98 F (36.7 C)     Temp Source 11/13/21 1213 Oral     SpO2 11/13/21 1213 98 %     Weight --      Height --      Head Circumference --      Peak Flow --      Pain Score 11/13/21 1215 4     Pain Loc --      Pain Edu? --       Excl. in GC? --    No data found.  Updated Vital Signs BP 102/71 (BP Location: Right Arm)   Pulse 71   Temp 98 F (36.7 C) (Oral)   Resp 16   SpO2 98%   Breastfeeding No   Visual Acuity Right Eye Distance:   Left Eye Distance:   Bilateral Distance:    Right Eye Near:   Left Eye Near:    Bilateral Near:     Physical Exam Vitals and nursing note reviewed.  Constitutional:      General: She is not in acute distress.    Appearance: Normal appearance.  HENT:     Head: Normocephalic.     Mouth/Throat:     Mouth: Mucous membranes are moist.  Eyes:     Extraocular Movements: Extraocular movements intact.     Pupils: Pupils are equal, round, and reactive to light.  Cardiovascular:     Rate and Rhythm: Regular rhythm.     Heart sounds: Normal heart sounds.  Pulmonary:     Effort: Pulmonary effort is normal. No respiratory distress.     Breath sounds: Normal breath sounds. No stridor. No wheezing, rhonchi or rales.  Abdominal:     General: Bowel sounds are normal.     Palpations: Abdomen is soft.  Musculoskeletal:     Cervical back: Normal range of motion.  Lymphadenopathy:     Cervical: No cervical adenopathy.  Skin:    General: Skin is warm and dry.     Comments: Induration to the left buttock that measures approximately 10 cm x 6 cm.  Area is mildly raised and erythematous, warm to palpation.  There is no fluctuance, oozing, or drainage present.  Neurological:     General: No focal deficit present.     Mental Status: She is alert and oriented to person, place, and time.  Psychiatric:        Mood and Affect: Mood normal.      UC Treatments / Results  Labs (all labs ordered are listed, but only abnormal results are displayed) Labs Reviewed - No data to display  EKG  Radiology No results found.  Procedures Procedures (including critical care time)  Medications Ordered in UC Medications  dexamethasone (DECADRON) injection 10 mg (10 mg Intramuscular  Given 11/13/21 1233)    Initial Impression / Assessment and Plan / UC Course  I have reviewed the triage vital signs and the nursing notes.  Pertinent labs & imaging results that were available during my care of the patient were reviewed by me and considered in my medical decision making (see chart for details).  Patient presents with a possible insect bite to the left buttock.  On exam, patient has a large induration to the left buttocks that is warm to touch, raised, and erythematous.  Patient is uncertain of what may have bitten her.  Patient was given Decadron 10 mg IM injection during her visit.  We will start patient on prednisone and doxycycline for coverage for possible cellulitis.  Supportive care recommendations were given to the patient along with strict indications of when to go to the emergency department.  Patient verbalizes understanding.  All questions were answered. Final Clinical Impressions(s) / UC Diagnoses   Final diagnoses:  Cellulitis of buttock     Discharge Instructions      Take medication as prescribed. May take over-the-counter Tylenol or ibuprofen as needed for pain or discomfort.  May also take over-the-counter Benadryl at bedtime to help with itching. Cool compresses to the affected area to help with inflammation, and swelling. Avoid hot baths or showers while symptoms persist. Avoid scratching, rubbing, or manipulating the area while symptoms persist. Go to the emergency department immediately if you develop fever, chills, nausea, vomiting, chest pain, or if the area becomes larger, develops a foul-smelling drainage, has increased redness, or other concerns. Follow-up as needed.      ED Prescriptions     Medication Sig Dispense Auth. Provider   predniSONE (DELTASONE) 20 MG tablet Take 2 tablets (40 mg total) by mouth daily with breakfast for 5 days. 10 tablet Zakiyyah Savannah-Warren, Sadie Haber, NP   doxycycline (VIBRA-TABS) 100 MG tablet Take 1 tablet (100 mg  total) by mouth 2 (two) times daily for 7 days. 14 tablet Ly Wass-Warren, Sadie Haber, NP      PDMP not reviewed this encounter.   Abran Cantor, NP 11/13/21 1239

## 2021-11-13 NOTE — ED Triage Notes (Signed)
Pt reports swelling and burning in the left buttock after an insect bite last night

## 2021-11-13 NOTE — Discharge Instructions (Signed)
Take medication as prescribed. May take over-the-counter Tylenol or ibuprofen as needed for pain or discomfort.  May also take over-the-counter Benadryl at bedtime to help with itching. Cool compresses to the affected area to help with inflammation, and swelling. Avoid hot baths or showers while symptoms persist. Avoid scratching, rubbing, or manipulating the area while symptoms persist. Go to the emergency department immediately if you develop fever, chills, nausea, vomiting, chest pain, or if the area becomes larger, develops a foul-smelling drainage, has increased redness, or other concerns. Follow-up as needed.

## 2022-01-01 ENCOUNTER — Telehealth (HOSPITAL_COMMUNITY): Payer: Self-pay | Admitting: *Deleted

## 2022-01-01 NOTE — Telephone Encounter (Signed)
Encounter opened by mistake. Deforest Hoyles, RN, 01/01/22, [phone]

## 2022-01-31 ENCOUNTER — Telehealth (HOSPITAL_COMMUNITY): Payer: Self-pay | Admitting: *Deleted

## 2022-01-31 NOTE — Telephone Encounter (Signed)
Telephone encounter opened in error. Deforest Hoyles, RN, 01/31/22, [phone]

## 2022-05-21 ENCOUNTER — Ambulatory Visit: Payer: 59 | Admitting: Adult Health

## 2022-05-24 ENCOUNTER — Emergency Department (HOSPITAL_COMMUNITY)
Admission: EM | Admit: 2022-05-24 | Discharge: 2022-05-24 | Disposition: A | Discharge status: Home / Self Care | Payer: Medicaid Other | Attending: Emergency Medicine | Admitting: Emergency Medicine

## 2022-05-24 DIAGNOSIS — R1030 Lower abdominal pain, unspecified: Secondary | ICD-10-CM | POA: Diagnosis not present

## 2022-05-24 DIAGNOSIS — N939 Abnormal uterine and vaginal bleeding, unspecified: Secondary | ICD-10-CM | POA: Insufficient documentation

## 2022-05-24 DIAGNOSIS — Z91040 Latex allergy status: Secondary | ICD-10-CM | POA: Insufficient documentation

## 2022-05-24 DIAGNOSIS — N898 Other specified noninflammatory disorders of vagina: Secondary | ICD-10-CM | POA: Insufficient documentation

## 2022-05-24 LAB — URINALYSIS, ROUTINE W REFLEX MICROSCOPIC
Bilirubin Urine: NEGATIVE
Glucose, UA: NEGATIVE mg/dL
Ketones, ur: NEGATIVE mg/dL
Leukocytes,Ua: NEGATIVE
Nitrite: NEGATIVE
Protein, ur: NEGATIVE mg/dL
Specific Gravity, Urine: 1.011 (ref 1.005–1.030)
pH: 6 (ref 5.0–8.0)

## 2022-05-24 LAB — CBC WITH DIFFERENTIAL/PLATELET
Abs Immature Granulocytes: 0 10*3/uL (ref 0.00–0.07)
Basophils Absolute: 0 10*3/uL (ref 0.0–0.1)
Basophils Relative: 1 %
Eosinophils Absolute: 0.4 10*3/uL (ref 0.0–0.5)
Eosinophils Relative: 10 %
HCT: 39.6 % (ref 36.0–46.0)
Hemoglobin: 12.9 g/dL (ref 12.0–15.0)
Immature Granulocytes: 0 %
Lymphocytes Relative: 62 %
Lymphs Abs: 2.4 10*3/uL (ref 0.7–4.0)
MCH: 26.9 pg (ref 26.0–34.0)
MCHC: 32.6 g/dL (ref 30.0–36.0)
MCV: 82.7 fL (ref 80.0–100.0)
Monocytes Absolute: 0.5 10*3/uL (ref 0.1–1.0)
Monocytes Relative: 13 %
Neutro Abs: 0.5 10*3/uL — ABNORMAL LOW (ref 1.7–7.7)
Neutrophils Relative %: 14 %
Platelets: 260 10*3/uL (ref 150–400)
RBC: 4.79 MIL/uL (ref 3.87–5.11)
RDW: 13.7 % (ref 11.5–15.5)
WBC: 3.9 10*3/uL — ABNORMAL LOW (ref 4.0–10.5)
nRBC: 0 % (ref 0.0–0.2)

## 2022-05-24 LAB — COMPREHENSIVE METABOLIC PANEL
ALT: 21 U/L (ref 0–44)
AST: 33 U/L (ref 15–41)
Albumin: 3.6 g/dL (ref 3.5–5.0)
Alkaline Phosphatase: 105 U/L (ref 38–126)
Anion gap: 9 (ref 5–15)
BUN: 7 mg/dL (ref 6–20)
CO2: 19 mmol/L — ABNORMAL LOW (ref 22–32)
Calcium: 9 mg/dL (ref 8.9–10.3)
Chloride: 107 mmol/L (ref 98–111)
Creatinine, Ser: 0.87 mg/dL (ref 0.44–1.00)
GFR, Estimated: >60 mL/min (ref >60)
Glucose, Bld: 123 mg/dL — ABNORMAL HIGH (ref 70–99)
Potassium: 4.9 mmol/L (ref 3.5–5.1)
Sodium: 135 mmol/L (ref 135–145)
Total Bilirubin: 1 mg/dL (ref 0.3–1.2)
Total Protein: 6.8 g/dL (ref 6.5–8.1)

## 2022-05-24 LAB — I-STAT BETA HCG BLOOD, ED (MC, WL, AP ONLY): I-stat hCG, quantitative: <5 m[IU]/mL (ref <5)

## 2022-05-24 LAB — LIPASE, BLOOD: Lipase: 30 U/L (ref 11–51)

## 2022-05-24 NOTE — ED Provider Notes (Signed)
[facility] Provider Note   CSN: [id number] Arrival date & time: 05/24/22  1457     History  Chief Complaint  Patient presents with   Vaginal Prolapse   vaginal issues    Michelle Stephenson is a 27 y.o. female with history of HSV, sickle cell trait, anxiety, depression, bipolar 1 disorder who presents the emergency department complaining of vaginal prolapse and vaginal bleeding.  Patient states that she experienced vaginal prolapse after having her second child last year.  She had followed up with the OB/GYN at her 6-week appointment.  She did not feel her concerns were being addressed.  She did have a Pap smear performed at that time, but did not receive the results.  Reports 1 abnormal Pap smear a few years ago in [place] where she used to live, but had moved before she could follow-up.  She states that she has been having ongoing vaginal bleeding for about a month or so.  This is waxing and waning in severity.  She does have associated lower abdominal cramping.  Sometimes she notices some clear vaginal discharge.  Today she felt as though she was experiencing prolapse, and when she tried to reduce it herself she saw a large blood clot that worried her.  She is not currently sexually active, has no concerns for STDs.  HPI     Home Medications Prior to Admission medications   Medication Sig Start Date End Date Taking? Authorizing Provider  acetaminophen (TYLENOL) 325 MG tablet Take 2 tablets (650 mg total) by mouth every 4 (four) hours as needed (for pain scale < 4). Patient not taking: Reported on 03/18/2021 03/05/21   Fabiola Backer, MD  acyclovir (ZOVIRAX) 400 MG tablet Take 1 tablet (400 mg total) by mouth 3 (three) times daily. Patient not taking: Reported on 03/18/2021 02/03/21   Roma Schanz, CNM  Benzoyl Peroxide 10 % CREA Apply 1 application topically daily as needed (boils).    [provider]  etonogestrel  (NEXPLANON) 68 MG IMPL implant 1 each by Subdermal route once.    [provider]  Ferrous Gluconate-C-Folic Acid (IRON-C PO) Take 1 tablet by mouth daily.    [provider]  ibuprofen (ADVIL) 600 MG tablet Take 1 tablet (600 mg total) by mouth every 6 (six) hours. Patient taking differently: Take 600 mg by mouth every 6 (six) hours as needed for moderate pain. 03/05/21   Fabiola Backer, MD      Allergies    Latex    Review of Systems   Review of Systems  Gastrointestinal:  Positive for abdominal pain.  Genitourinary:  Positive for menstrual problem and vaginal bleeding.       Vaginal prolapse  All other systems reviewed and are negative.   Physical Exam Updated Vital Signs BP 102/76   Pulse (!) 49   Temp 97.9 F (36.6 C) (Oral)   Resp 14   SpO2 99%  Physical Exam Vitals and nursing note reviewed. Exam conducted with a chaperone present.  Constitutional:      Appearance: Normal appearance.  HENT:     Head: Normocephalic and atraumatic.  Eyes:     Conjunctiva/sclera: Conjunctivae normal.  Cardiovascular:     Rate and Rhythm: Normal rate and regular rhythm.  Pulmonary:     Effort: Pulmonary effort is normal. No respiratory distress.     Breath sounds: Normal breath sounds.  Abdominal:     General: There is  no distension.     Palpations: Abdomen is soft.     Tenderness: There is abdominal tenderness. There is no guarding or rebound.     Comments: Generalized lower abdominal tenderness to palpation  Genitourinary:    General: Normal vulva.     Exam position: Lithotomy position.     Urethra: No prolapse.     Vagina: No signs of injury and foreign body. Bleeding present. No lesions or prolapsed vaginal walls.     Cervix: Cervical bleeding present. No cervical motion tenderness or discharge.     Uterus: Normal.      Adnexa: Right adnexa normal and left adnexa normal.  Skin:    General: Skin is warm and dry.  Neurological:     General: No focal  deficit present.     Mental Status: She is alert.     ED Results / Procedures / Treatments   Labs (all labs ordered are listed, but only abnormal results are displayed) Labs Reviewed  CBC WITH DIFFERENTIAL/PLATELET - Abnormal; Notable for the following components:      Result Value   WBC 3.9 (*)    Neutro Abs 0.5 (*)    All other components within normal limits  COMPREHENSIVE METABOLIC PANEL - Abnormal; Notable for the following components:   CO2 19 (*)    Glucose, Bld 123 (*)    All other components within normal limits  URINALYSIS, ROUTINE W REFLEX MICROSCOPIC - Abnormal; Notable for the following components:   Hgb urine dipstick LARGE (*)    Bacteria, UA RARE (*)    All other components within normal limits  LIPASE, BLOOD  PATHOLOGIST SMEAR REVIEW  I-STAT BETA HCG BLOOD, ED ([facility], [facility], [facility] ONLY)    EKG None  Radiology No results found.  Procedures Procedures    Medications Ordered in ED Medications - No data to display  ED Course/ Medical Decision Making/ A&P                             Medical Decision Making  This patient is a 27 y.o. female  who presents to the ED for concern of vaginal bleeding and vaginal prolapse.   Differential diagnoses prior to evaluation: The emergent differential diagnosis includes, but is not limited to,  Abnormal uterine bleeding, threatened miscarriage, incomplete miscarriage, normal bleeding from an early trimester pregnancy, ectopic pregnancy, vaginal/cervical trauma, subchorionic hemorrhage/hematoma . This is not an exhaustive differential.   Past Medical History / Co-morbidities: HSV, sickle cell trait, anxiety, depression, bipolar 1 disorder  Additional history: Chart reviewed. Pertinent results include: ER visit noted for vaginal wall prolapse in Jan 2023  Physical Exam: Physical exam performed. The pertinent findings include: Normal vital signs, no acute distress.  Abdomen soft and nontender.  GU exam performed with  chaperone significant for vaginal and cervical bleeding.  No vaginal prolapse. No discharge, foreign bodies, lesions, or injuries noted.  Mild lower abdominal tenderness to palpation with bimanual exam, but no CMT.    Overall nonsurgical abdominal exam  Lab Tests/Imaging studies: I personally interpreted labs/imaging and the pertinent results include: No leukocytosis, normal hemoglobin.  CMP unremarkable.  Urinalysis with large hemoglobin, likely contaminated from vaginal bleeding.  Negative pregnancy, normal lipase.Marland Kitchen   Disposition: After consideration of the diagnostic results and the patients response to treatment, I feel that emergency department workup does not suggest an emergent condition requiring admission or immediate intervention beyond what has been performed at this time.  The plan is: Discharged home with strong recommendation to establish with a PCP and GYN.  Patient plans to do so.  She just wanted to rule out "the scary things".  She knows that she needs to get a Pap smear, she has had an abnormal one in the past.  No sign of prolapse or complications today.  She is not requiring a blood transfusion.  No emergent etiology found as cause for patient's symptoms today.  The patient is safe for discharge and has been instructed to return immediately for worsening symptoms, change in symptoms or any other concerns.  Final Clinical Impression(s) / ED Diagnoses Final diagnoses:  Abnormal vaginal bleeding    Rx / DC Orders ED Discharge Orders     None      Portions of this report may have been transcribed using voice recognition software. Every effort was made to ensure accuracy; however, inadvertent computerized transcription errors may be present.    Kateri Plummer, PA-C 05/24/22 Madison A, DO 05/25/22 2040

## 2022-05-24 NOTE — ED Triage Notes (Signed)
Patient complains of vaginal prolapse that has been getting progressively worsening since February 2023. Patient states she attempted to push tissue back inside and started bleeding.

## 2022-05-24 NOTE — ED Provider Triage Note (Signed)
Emergency Medicine Provider Triage Evaluation Note  Michelle Stephenson , Stephenson 27 y.o. female  was evaluated in triage.  Pt complains of concerns for vaginal prolapse.  Notes this been ongoing since February 2023.  Notes that Stephenson couple days ago she had to push her vaginal canal up while at work.  Notes that after she had Stephenson clot come out.  She notes that she has had Stephenson menstrual cycle since February 2024.  She does have an OB/GYN at this time.  Has associated generalized abdominal pain.  Also notes that she has been having issues with constipation recently.  Denies urinary symptoms, vaginal discharge, nausea, vomiting.  Review of Systems  Positive:  Negative:   Physical Exam  BP 107/66 (BP Location: Right Arm)   Pulse 87   Temp 98.7 F (37.1 C)   Resp 16   SpO2 99%  Gen:   Awake, no distress   Resp:  Michelle effort  MSK:   Moves extremities without difficulty  Other:  GU exam deferred in triage. Diffuse abdominal TTP.   Medical Decision Making  Medically screening exam initiated at 3:16 PM.  Appropriate orders placed.  Michelle Stephenson was informed that the remainder of the evaluation will be completed by another provider, this initial triage assessment does not replace that evaluation, and the importance of remaining in the ED until their evaluation is complete.  Workup initiated   Michelle Recinos A, PA-C 05/24/22 1530

## 2022-05-24 NOTE — Discharge Instructions (Addendum)
You were seen in the emergency department today for ongoing vaginal bleeding and possible prolapse.  As we discussed your blood work was unremarkable today.  Your blood counts are normal, no evidence of anemia.  On your exam I do not see any vaginal prolapse at this time.  I did not see any other source of bleeding.  Often times when we cannot find a cause for uterine bleeding, it is imperative that you follow-up with the OB/GYN.  You can take over-the-counter NSAIDs as needed for pain, and this may lessen some bleeding as well.  I have attached the contact information for both [facility] and [facility] for you to make appointments and establish with GYN and PCP if you do not have these.  Continue to monitor how you're doing and return to the ER for new or worsening symptoms.

## 2022-05-26 ENCOUNTER — Encounter (INDEPENDENT_AMBULATORY_CARE_PROVIDER_SITE_OTHER): Payer: Self-pay

## 2022-05-26 LAB — PATHOLOGIST SMEAR REVIEW

## 2022-06-03 ENCOUNTER — Ambulatory Visit: Payer: Medicaid Other | Admitting: Family Medicine

## 2022-10-31 ENCOUNTER — Telehealth: Payer: Medicaid Other | Admitting: Family Medicine

## 2022-10-31 DIAGNOSIS — N926 Irregular menstruation, unspecified: Secondary | ICD-10-CM | POA: Diagnosis not present

## 2022-10-31 NOTE — Progress Notes (Signed)
Virtual Visit Consent   [name], you are scheduled for a virtual visit with a [facility] provider today. Just as with appointments in the office, your consent must be obtained to participate. Your consent will be active for this visit and any virtual visit you may have with one of our providers in the next 365 days. If you have a MyChart account, a copy of this consent can be sent to you electronically.  As this is a virtual visit, video technology does not allow for your provider to perform a traditional examination. This may limit your provider's ability to fully assess your condition. If your provider identifies any concerns that need to be evaluated in person or the need to arrange testing (such as labs, EKG, etc.), we will make arrangements to do so. Although advances in technology are sophisticated, we cannot ensure that it will always work on either your end or our end. If the connection with a video visit is poor, the visit may have to be switched to a telephone visit. With either a video or telephone visit, we are not always able to ensure that we have a secure connection.  By engaging in this virtual visit, you consent to the provision of healthcare and authorize for your insurance to be billed (if applicable) for the services provided during this visit. Depending on your insurance coverage, you may receive a charge related to this service.  I need to obtain your verbal consent now. Are you willing to proceed with your visit today? Michelle Stephenson has provided verbal consent on 10/31/2022 for a virtual visit (video or telephone). Michelle Stephenson, [place]  Date: 10/31/2022 12:35 PM  Virtual Visit via Video Note   I, Michelle Stephenson, connected with  Michelle Stephenson  ([id number], 08/29/1995) on 10/31/22 at 12:30 PM EDT by a video-enabled telemedicine application and verified that I am speaking with the correct person using two identifiers.  Location: Patient: Virtual Visit Location Patient:  Home Provider: Virtual Visit Location Provider: Home Office   I discussed the limitations of evaluation and management by telemedicine and the availability of in person appointments. The patient expressed understanding and agreed to proceed.    History of Present Illness: MALLARIE Michelle Stephenson is a 27 y.o. who identifies as a female who was assigned female at birth, and is being seen today for c/o my mom is worried that I need to go to the hospital.  Pt states last night she was at work and her stomach was hurting bad and noticed she had her menstrual cycle.  Pt c/o feeling light headed, headache and chest pain. Pt states she feels tired but she is always tired because she works nights and has two toddlers. Pt states her mother will take her to the urgent care. Pt reports heavy menses on second day of cycle.   HPI: HPI  Problems:  Patient Active Problem List   Diagnosis Date Noted   Unstable fetal lie 03/04/2021   Marijuana use 02/04/2021   Encounter for supervision of normal pregnancy, antepartum 02/03/2021   Late prenatal care in second trimester 02/03/2021    Allergies:  Allergies  Allergen Reactions   Latex Itching   Medications:  Current Outpatient Medications:    acetaminophen (TYLENOL) 325 MG tablet, Take 2 tablets (650 mg total) by mouth every 4 (four) hours as needed (for pain scale < 4). (Patient not taking: Reported on 03/18/2021), Disp: , Rfl:    acyclovir (ZOVIRAX) 400 MG tablet, Take 1  tablet (400 mg total) by mouth 3 (three) times daily. (Patient not taking: Reported on 03/18/2021), Disp: 90 tablet, Rfl: 3   Benzoyl Peroxide 10 % CREA, Apply 1 application topically daily as needed (boils)., Disp: , Rfl:    etonogestrel (NEXPLANON) 68 MG IMPL implant, 1 each by Subdermal route once., Disp: , Rfl:    Ferrous Gluconate-C-Folic Acid (IRON-C PO), Take 1 tablet by mouth daily., Disp: , Rfl:    ibuprofen (ADVIL) 600 MG tablet, Take 1 tablet (600 mg total) by mouth every 6 (six) hours.  (Patient taking differently: Take 600 mg by mouth every 6 (six) hours as needed for moderate pain.), Disp: 30 tablet, Rfl: 0  Observations/Objective: Patient is well-developed, well-nourished in no acute distress.  Resting comfortably but ill appearing  Head is normocephalic, atraumatic.  No labored breathing.  Speech is clear and coherent with logical content.  Patient is alert and oriented at baseline.    Assessment and Plan: 1. Abnormal bleeding in menstrual cycle  -Advise Pt to have her mother take her to the emergency room as she is very ill appearing with noticeable discomfort and complaint of chest pain. -Pt verbalized that she would have her mother take her to the hospital.   Follow Up Instructions: I discussed the assessment and treatment plan with the patient. The patient was provided an opportunity to ask questions and all were answered. The patient agreed with the plan and demonstrated an understanding of the instructions.  A copy of instructions were sent to the patient via MyChart unless otherwise noted below.     The patient was advised to call back or seek an in-person evaluation if the symptoms worsen or if the condition fails to improve as anticipated.  Time:  I spent 15 minutes with the patient via telehealth technology discussing the above problems/concerns.    Michelle Pandy, PA-C

## 2022-10-31 NOTE — Patient Instructions (Signed)
Carlean Purl Watanabe, thank you for joining Reed Pandy, PA-C for today's virtual visit.  While this provider is not your primary care provider (PCP), if your PCP is located in our provider database this encounter information will be shared with them immediately following your visit.   A [facility] MyChart account gives you access to today's visit and all your visits, tests, and labs performed at [facility] " click here if you don't have a [facility] MyChart account or go to [email or web address]  Consent: (Patient) Michelle Stephenson provided verbal consent for this virtual visit at the beginning of the encounter.  Current Medications:  Current Outpatient Medications:    acetaminophen (TYLENOL) 325 MG tablet, Take 2 tablets (650 mg total) by mouth every 4 (four) hours as needed (for pain scale < 4). (Patient not taking: Reported on 03/18/2021), Disp: , Rfl:    acyclovir (ZOVIRAX) 400 MG tablet, Take 1 tablet (400 mg total) by mouth 3 (three) times daily. (Patient not taking: Reported on 03/18/2021), Disp: 90 tablet, Rfl: 3   Benzoyl Peroxide 10 % CREA, Apply 1 application topically daily as needed (boils)., Disp: , Rfl:    etonogestrel (NEXPLANON) 68 MG IMPL implant, 1 each by Subdermal route once., Disp: , Rfl:    Ferrous Gluconate-C-Folic Acid (IRON-C PO), Take 1 tablet by mouth daily., Disp: , Rfl:    ibuprofen (ADVIL) 600 MG tablet, Take 1 tablet (600 mg total) by mouth every 6 (six) hours. (Patient taking differently: Take 600 mg by mouth every 6 (six) hours as needed for moderate pain.), Disp: 30 tablet, Rfl: 0   Medications ordered in this encounter:  No orders of the defined types were placed in this encounter.    *If you need refills on other medications prior to your next appointment, please contact your pharmacy*  Follow-Up: Call back or seek an in-person evaluation if the symptoms worsen or if the condition fails to improve as anticipated.  [facility]  [phone]  Other Instructions Menorrhagia Menorrhagia is a form of abnormal uterine bleeding in which menstrual periods are heavy or last longer than normal. With menorrhagia, the periods may cause enough blood loss and cramping that a woman becomes unable to take part in her usual activities. What are the causes? Common causes of this condition include: Polyps or fibroids. These are noncancerous growths in the uterus. An imbalance of the hormones estrogen and progesterone. Anovulation, which occurs when one of the ovaries does not release an egg during one or more months. A problem with the thyroid gland (hypothyroidism). Side effects of having an intrauterine device (IUD). Side effects of some medicines, such as NSAIDs or blood thinners. A bleeding disorder that stops the blood from clotting normally. In some cases, the cause of this condition is not known. What increases the risk? You are more likely to develop this condition if you have cancer of the uterus. What are the signs or symptoms? Symptoms of this condition include: Routinely having to change your pad or tampon every 1-2 hours because it is soaked. Needing to use pads and tampons at the same time because of heavy bleeding. Needing to wake up to change your pads or tampons during the night. Passing blood clots larger than 1 inch (2.5 cm) in size. Having bleeding that lasts for more than 7 days. Having symptoms of low iron levels (anemia), such as tiredness (fatigue) or shortness of breath. How is this diagnosed? This condition may be diagnosed based on: A  physical exam. Your symptoms and menstrual history. Tests, such as: Blood tests to check if you are pregnant or if you have hormonal changes, a bleeding or thyroid disorder, anemia, or other problems. Pap test to check for cancerous changes, infections, or inflammation. Endometrial biopsy. This test involves removing a tissue sample from the lining of the uterus  (endometrium) to be examined under a microscope. Pelvic ultrasound. This test uses sound waves to create images of your uterus, ovaries, and vagina. The images can show if you have fibroids or other growths. Hysteroscopy. For this test, a thin, flexible tube with a light on the end (hysteroscope) is used to look inside your uterus. How is this treated? Treatment may not be needed for this condition. If it is needed, the best treatment for you will depend on: Whether you need to prevent pregnancy. Your desire to have children in the future. The cause and severity of your bleeding. Your personal preference. Medicine Medicines are the first step in treatment. You may be treated with: Hormonal birth control methods. These treatments reduce bleeding during your menstrual period. They include: Birth control pills. Skin patch. Vaginal ring. Shots (injections) that you get every 3 months. Hormonal IUD. Implants that go under the skin. Medicines that thicken the blood and slow bleeding. Medicines that reduce swelling, such as ibuprofen. Medicines that contain an artificial (synthetic) hormone called progestin. Medicines that make the ovaries stop working for a short time. Iron supplements to treat anemia.  Surgery If medicines do not work, surgery may be done. Surgical options may include: Dilation and curettage (D&C). In this procedure, your health care provider opens the lowest part of the uterus (cervix) and then scrapes or suctions tissue from the endometrium. This reduces menstrual bleeding. Operative hysteroscopy. In this procedure, a hysteroscope is used to view your uterus and help remove polyps that may be causing heavy periods. Endometrial ablation. This is when various techniques are used to permanently destroy your entire endometrium. After endometrial ablation, most women have little or no menstrual flow. This procedure reduces your ability to become pregnant. Endometrial resection.  In this procedure, an electrosurgical wire loop is used to remove the endometrium. This procedure reduces your ability to become pregnant. Hysterectomy. This is surgical removal of your uterus. This is a permanent procedure that stops menstrual periods. Pregnancy is not possible after a hysterectomy. Follow these instructions at home: Medicines Take over-the-counter and prescription medicines only as told by your health care provider. This includes iron pills. Do not change or switch medicines without asking your health care provider. Do not take aspirin or medicines that contain aspirin 1 week before or during your menstrual period. Aspirin may make bleeding worse. Managing constipation Your iron pills may cause constipation. If you are taking prescription iron supplements, you may need to take these actions to prevent or treat constipation: Drink enough fluid to keep your urine pale yellow. Take over-the-counter or prescription medicines. Eat foods that are high in fiber, such as beans, whole grains, and fresh fruits and vegetables. Limit foods that are high in fat and processed sugars, such as fried or sweet foods. General instructions If you need to change your sanitary pad or tampon more than once every 2 hours, limit your activity until the bleeding stops. Eat well-balanced meals, including foods that are high in iron. Foods that have a lot of iron include leafy green vegetables, meat, liver, eggs, and whole-grain breads and cereals. Do not try to lose weight until the abnormal bleeding  has stopped and your blood iron level is back to normal. If you need to lose weight, work with your health care provider to lose weight safely. Keep all follow-up visits. This is important. Contact a health care provider if: You soak through a pad or tampon every 1 or 2 hours, and this happens every time you have a period. You need to use pads and tampons at the same time because you are bleeding so  much. You have nausea, vomiting, diarrhea, or other problems related to medicines you are taking. Get help right away if: You soak through more than a pad or tampon in 1 hour. You pass clots bigger than 1 inch (2.5 cm) wide. You feel short of breath. You feel like your heart is beating too fast. You feel dizzy or you faint. You feel very weak or tired. Summary Menorrhagia is a form of abnormal uterine bleeding in which menstrual periods are heavy or last longer than normal. Treatment may not be needed for this condition. If it is needed, it may include medicines or procedures. Take over-the-counter and prescription medicines only as told by your health care provider. This includes iron pills. Get help right away if you have heavy bleeding that soaks through more than a pad or tampon in 1 hour, you pass large clots, or you feel dizzy, short of breath, or very weak or tired. This information is not intended to replace advice given to you by your health care provider. Make sure you discuss any questions you have with your health care provider. Document Revised: 10/24/2019 Document Reviewed: 10/24/2019 Elsevier Patient Education  2024 Elsevier Inc.    If you have been instructed to have an in-person evaluation today at a local Urgent Care facility, please use the link below. It will take you to a list of all of our available [facility], including address, phone number and hours of operation. Please do not delay care.  [facility]  If you or a family member do not have a primary care provider, use the link below to schedule a visit and establish care. When you choose a [facility] primary care physician or advanced practice provider, you gain a long-term partner in health. Find a Primary Care Provider  Learn more about [facility] in-office and virtual care options: [facility] Get Care Now

## 2023-01-14 ENCOUNTER — Ambulatory Visit: Payer: 59 | Admitting: Podiatry

## 2023-09-30 ENCOUNTER — Ambulatory Visit: Admitting: Obstetrics & Gynecology

## 2023-10-07 ENCOUNTER — Ambulatory Visit (INDEPENDENT_AMBULATORY_CARE_PROVIDER_SITE_OTHER): Admitting: Family Medicine

## 2023-10-07 ENCOUNTER — Encounter: Payer: Self-pay | Admitting: Family Medicine

## 2023-10-07 VITALS — BP 120/80 | HR 71 | Ht 62.0 in | Wt 219.8 lb

## 2023-10-07 DIAGNOSIS — S73004S Unspecified dislocation of right hip, sequela: Secondary | ICD-10-CM

## 2023-10-07 DIAGNOSIS — Z6841 Body Mass Index (BMI) 40.0 and over, adult: Secondary | ICD-10-CM

## 2023-10-07 DIAGNOSIS — Z713 Dietary counseling and surveillance: Secondary | ICD-10-CM | POA: Diagnosis not present

## 2023-10-07 DIAGNOSIS — R7301 Impaired fasting glucose: Secondary | ICD-10-CM

## 2023-10-07 DIAGNOSIS — E66813 Obesity, class 3: Secondary | ICD-10-CM

## 2023-10-07 LAB — POCT GLYCOSYLATED HEMOGLOBIN (HGB A1C): Hemoglobin A1C: 5.5 % (ref 4.0–5.6)

## 2023-10-07 MED ORDER — ZEPBOUND 2.5 MG/0.5ML ~~LOC~~ SOAJ
2.5000 mg | SUBCUTANEOUS | 0 refills | Status: DC
Start: 2023-10-07 — End: 2023-11-06

## 2023-10-07 NOTE — Progress Notes (Signed)
 Name: Michelle Stephenson   Date of Visit: 10/07/23   Date of last visit with me: Visit date not found   CHIEF COMPLAINT:  Chief Complaint  Patient presents with   Establish Care    New Patient, check thyroid, discuss weight.        HPI:  Discussed the use of AI scribe software for clinical note transcription with the patient, who gave verbal consent to proceed.  History of Present Illness   Michelle Stephenson is a 28 year old female who presents with concerns about weight management and a history of thyroid issues.  She has not seen a regular doctor since 2018 and is concerned about managing her weight as she approaches 28 years old. She had a previous issue with her thyroid during college, which was not followed up on after an initial consultation with a campus doctor.  Her current lifestyle includes daily workouts and a diet leaning towards keto, although she finds it challenging to perfect the keto diet. She is concerned about the potential need for long-term weight management medication.  She has a history of high blood sugar levels, as indicated by previous lab results, and is aware of the need to monitor her A1c levels.  In April, she fell down steps while moving heavy items and was told by an EMT that she had dislocated her hip. She did not seek hospital care but was assisted by an EMT who reportedly relocated her hip. Since then, she experiences clicking in her hip during workouts and occasional instability if she lies in bed too long. She associates her previous minor back issues with her weight.  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 No history of pancreatic cancer, and she is not pregnant or breastfeeding.         OBJECTIVE:       10/07/2023    2:25 PM  Depression screen PHQ 2/9  Decreased Interest 3  Down, Depressed, Hopeless 0  PHQ - 2 Score 3  Altered sleeping 0  Tired, decreased energy 0  Change in appetite 2  Feeling bad or failure about  yourself  0  Trouble concentrating 0  Suicidal thoughts 0  PHQ-9 Score 5     BP Readings from Last 3 Encounters:  10/07/23 120/80  05/24/22 102/76  11/13/21 102/71    BP 120/80   Pulse 71   Ht 5' 2 (1.575 m)   Wt 219 lb 12.8 oz (99.7 kg)   SpO2 98%   BMI 40.20 kg/m    Physical Exam   MEASUREMENTS: BMI- 40.0.      Physical Exam Constitutional:      Appearance: Normal appearance. She is obese.  Cardiovascular:     Rate and Rhythm: Normal rate.  Neurological:     General: No focal deficit present.     Mental Status: She is alert and oriented to person, place, and time. Mental status is at baseline.     ASSESSMENT/PLAN:   Assessment & Plan Class 3 severe obesity due to excess calories without serious comorbidity with body mass index (BMI) of 40.0 to 44.9 in adult  Elevated fasting glucose  Weight loss counseling, encounter for  Dislocated hip, right, sequela    Assessment and Plan    Obesity BMI of 40. Discussed GLP-1 receptor agonists, specifically Zepbound , for weight loss. Explained medication reduces cravings and hunger. Emphasized following hunger cues. Discussed dose titration. Insurance may require trial of oral medication first. -  Order Zepbound  for weight loss. - Instruct to inject Zepbound  once weekly. - Advise to follow hunger cues and avoid eating out of routine. - Encourage to stay hydrated. - Instruct to message via MyChart in three weeks to evaluate medication effectiveness. - Schedule follow-up in two months to re-evaluate weight management plan. -Patient meets criteria for anti-obesity pharmacotherapy. BMI is >=30 kg/m with weight-related complications . Patient has attempted lifestyle modification, including a reduced-calorie diet and regular exercise (3 times per week), without sufficient or sustained weight loss. Given persistent overweight status and associated health risks, initiation of Wegovy  (semaglutide ) weekly is medically indicated as  an adjunct to ongoing lifestyle efforts. Goal is to achieve and maintain clinically meaningful weight reduction, improve metabolic health, and reduce risk of future obesity-related complications.  Possible thyroid disorder Possible hypothyroidism contributing to weight gain. - Order TSH test to evaluate thyroid function.  Possible prediabetes or diabetes Previous labs indicated high blood sugar. Discussed importance of monitoring A1c. - Order A1c test to evaluate blood sugar control.  Right hip pain and mechanical symptoms after fall Fall in April resulted in right hip pain and clicking. Likely subluxation or labral tear. Hip clicks during workouts. - Order x-ray of right hip to assess for subluxation or labral tear. - Instruct to visit [facility] for x-ray at convenience.         Myan Locatelli A. Vita MD [facility]

## 2023-10-08 ENCOUNTER — Ambulatory Visit: Payer: Self-pay | Admitting: Family Medicine

## 2023-10-08 LAB — TSH RFX ON ABNORMAL TO FREE T4: TSH: 3.05 u[IU]/mL (ref 0.450–4.500)

## 2023-10-11 ENCOUNTER — Telehealth: Payer: Self-pay

## 2023-10-11 DIAGNOSIS — E66813 Obesity, class 3: Secondary | ICD-10-CM

## 2023-10-11 NOTE — Telephone Encounter (Signed)
 Hi MD Vita,         Our Team has received a Prior Authorization request for Rx Zepbound . However with Medicaid the Insurance requires the patient to trial and fail Weogvy first. Please advise if you would to change therapy?

## 2023-10-12 ENCOUNTER — Other Ambulatory Visit (HOSPITAL_COMMUNITY): Payer: Self-pay

## 2023-10-12 MED ORDER — WEGOVY 0.25 MG/0.5ML ~~LOC~~ SOAJ: 0.2500 mg | SUBCUTANEOUS | 0 refills | Status: DC

## 2023-10-13 ENCOUNTER — Telehealth: Payer: Self-pay

## 2023-10-13 ENCOUNTER — Other Ambulatory Visit (HOSPITAL_COMMUNITY): Payer: Self-pay

## 2023-10-13 NOTE — Telephone Encounter (Signed)
 Hi Dr Vita,   Pharmacy Patient Advocate Encounter  Received notification from [facility] that Prior Authorization for Wegovy  0.25MG /0.5ML auto-injectors has been DENIED.  Full denial letter will be uploaded to the media tab. See denial reason below.   Insurance companies are becoming increasingly stricter about requiring thorough documentation of lifestyle modifications in the patient's chart at each visit. This includes detailed records of diet recommendations (caloric intake, etc), exercise plans (amount of time/wk, etc), and an emphasis on the patient's commitment to continuing these efforts while on medication.  Without this additional documentation in the chart notes, a prior authorization will most likely be denied.  Dr.Jha, If you're willing to addened the chartnote to [employer]. I can resubmit the Prior Authorization. Let me know.please  Thanks,     PA #/Case ID/Reference #: [id number]

## 2023-10-13 NOTE — Telephone Encounter (Signed)
 Hi MD Vita,   P/A has been Approved for wegovy . The addendum was perfect. Rx is Effective until 2.20.26. The cost is 4.00

## 2023-10-13 NOTE — Telephone Encounter (Signed)
 Requested addendum has been made by the provider. Rx Is now Appproved and the pts pref'd pharmacy has been contacted to fill the RX-Wegovy   Pharmacy Patient Advocate Encounter  Received notification from [facility] MEDICAID that Prior Authorization for Wegovy  0.25MG /0.5ML auto-injectors has been APPROVED to 2.20.26. Ran test claim, Copay is $4.00. This test claim was processed through [facility]- copay amounts may vary at other pharmacies due to pharmacy/plan contracts, or as the patient moves through the different stages of their insurance plan.   PA #/Case ID/Reference #: [id number]

## 2023-10-14 ENCOUNTER — Ambulatory Visit: Admitting: Family Medicine

## 2023-10-14 ENCOUNTER — Telehealth: Payer: Self-pay

## 2023-10-14 NOTE — Telephone Encounter (Signed)
 Pt. Just saw Dr. Vita on 10/07/23 would you like them to come back in or can we put in a referral to ortho.   Copied from CRM [id number]. Topic: Referral - Request for Referral >> Oct 14, 2023 11:03 AM Graeme ORN wrote: Did the patient discuss referral with their provider in the last year? No (If No - schedule appointment) (If Yes - send message)  Appointment offered? No - called CAL to see if appt needed or referral could be put in advised to send CRM  Type of order/referral and detailed reason for visit: Patient states she broke her foot - went to urgent care they put a boot and referred her to Ortho. The would like provider to put in referral to closer ortho.   Preference of office, provider, location: [facility]  If referral order, have you been seen by this specialty before? N/A (If Yes, this issue or another issue? When? Where?  Can we respond through MyChart? No

## 2023-10-18 ENCOUNTER — Encounter: Payer: Self-pay | Admitting: Family Medicine

## 2023-10-18 ENCOUNTER — Ambulatory Visit (INDEPENDENT_AMBULATORY_CARE_PROVIDER_SITE_OTHER): Admitting: Family Medicine

## 2023-10-18 VITALS — BP 124/78 | HR 89 | Wt 218.2 lb

## 2023-10-18 DIAGNOSIS — R7301 Impaired fasting glucose: Secondary | ICD-10-CM | POA: Diagnosis not present

## 2023-10-18 DIAGNOSIS — Z6841 Body Mass Index (BMI) 40.0 and over, adult: Secondary | ICD-10-CM

## 2023-10-18 DIAGNOSIS — E66813 Obesity, class 3: Secondary | ICD-10-CM

## 2023-10-18 DIAGNOSIS — S92415A Nondisplaced fracture of proximal phalanx of left great toe, initial encounter for closed fracture: Secondary | ICD-10-CM

## 2023-10-18 NOTE — Progress Notes (Signed)
 Name: WAKEELAH SOLAN   Date of Visit: 10/18/23   Date of last visit with me: 10/07/2023   CHIEF COMPLAINT:  Chief Complaint  Patient presents with   Follow-up    Follow up on fractured toe. Wants to know if she can wrap it instead of wearing the boot. Pain level is about a 3. When she does a lot of walking pain level was about a 6.        HPI:  Discussed the use of AI scribe software for clinical note transcription with the patient, who gave verbal consent to proceed.  History of Present Illness   MARIANNY GORIS is a 28 year old female who presents with a nondisplaced fracture of the right great toe.  She has a nondisplaced fracture of the proximal phalanx of the right great toe, which occurred when she accidentally 'rocked into the curb' with her toe. She has a history of previous toe fractures that required cortisone shots and resulted in prolonged discomfort. She recalls a past incident where she fractured both big toes after falling out of a tractor trailer, leading to years of discomfort.  She experienced nausea and vomiting after taking a new medication for the first time on Saturday. She felt nauseated after eating a six-inch sub and drinking Gatorade, which she later vomited. She felt better after consuming a small yogurt the following morning. She has three more pens of her current medication left. She is currently on a starter dose.         OBJECTIVE:       10/07/2023    2:25 PM  Depression screen PHQ 2/9  Decreased Interest 3  Down, Depressed, Hopeless 0  PHQ - 2 Score 3  Altered sleeping 0  Tired, decreased energy 0  Change in appetite 2  Feeling bad or failure about yourself  0  Trouble concentrating 0  Suicidal thoughts 0  PHQ-9 Score 5     BP Readings from Last 3 Encounters:  10/18/23 124/78  10/07/23 120/80  05/24/22 102/76    BP 124/78   Pulse 89   Wt 218 lb 3.2 oz (99 kg)   SpO2 98%   BMI 39.91 kg/m    Physical Exam   MEASUREMENTS:  Weight- 218.      Physical Exam  Inspection reveals some mild swelling of the great left toe.  No signs of any angulation.  Mild tenderness to palpation on the dorsal aspect of the interphalangeal joint and phalanx.  No signs of any infection.  Range of motion is full at this time with some noted pain. ASSESSMENT/PLAN:   Assessment & Plan    Assessment and Plan    Closed nondisplaced fracture of proximal phalanx of right great toe Fracture is nondisplaced and stable but prone to poor healing if not managed properly. - Instruct to wear the boot for at least three weeks. - Advise against weight-bearing on the affected toe. - Offer a post-op shoe as an alternative if the boot is intolerable, but prefer the boot initially. - Schedule follow-up appointment in four weeks to reassess the fracture. - Previous note from urgent care reviewed on 8/21.   Obesity Currently on a starter dose of weight loss medication and experiencing weight loss. Emphasized importance of following hunger cues and adjusting portion sizes to maintain a calorie deficit. - Instruct to follow hunger cues and eat smaller portions. - Advise to carry snacks like a bar for controlled calorie intake. - Plan to  increase medication dose to 0.5 mg after current supply runs out. - Instruct to send a MyChart message for a refill request by the end of week two of the current supply.  Nausea and vomiting secondary to weight loss medication Nausea and vomiting likely due to overeating and not following hunger cues. - Advise to eat smaller portions and follow hunger cues. - Instruct to avoid overeating and high-calorie drinks like full-calorie Gatorade.         Tila Millirons A. Vita MD [facility]

## 2023-11-04 ENCOUNTER — Other Ambulatory Visit (HOSPITAL_COMMUNITY)
Admission: RE | Admit: 2023-11-04 | Discharge: 2023-11-04 | Disposition: A | Discharge status: Home / Self Care | Source: Ambulatory Visit | Attending: Obstetrics & Gynecology | Admitting: Obstetrics & Gynecology

## 2023-11-04 ENCOUNTER — Ambulatory Visit (INDEPENDENT_AMBULATORY_CARE_PROVIDER_SITE_OTHER): Admitting: Obstetrics & Gynecology

## 2023-11-04 ENCOUNTER — Encounter: Payer: Self-pay | Admitting: Obstetrics & Gynecology

## 2023-11-04 VITALS — BP 101/70 | HR 84 | Ht 62.0 in | Wt 218.0 lb

## 2023-11-04 DIAGNOSIS — Z01419 Encounter for gynecological examination (general) (routine) without abnormal findings: Secondary | ICD-10-CM | POA: Diagnosis not present

## 2023-11-04 DIAGNOSIS — Z1151 Encounter for screening for human papillomavirus (HPV): Secondary | ICD-10-CM

## 2023-11-04 DIAGNOSIS — Z3046 Encounter for surveillance of implantable subdermal contraceptive: Secondary | ICD-10-CM | POA: Diagnosis not present

## 2023-11-04 DIAGNOSIS — N939 Abnormal uterine and vaginal bleeding, unspecified: Secondary | ICD-10-CM | POA: Diagnosis not present

## 2023-11-04 DIAGNOSIS — Z124 Encounter for screening for malignant neoplasm of cervix: Secondary | ICD-10-CM | POA: Insufficient documentation

## 2023-11-04 NOTE — Progress Notes (Signed)
 WELL-WOMAN EXAMINATION Patient name: Michelle Stephenson MRN [id number]  Date of birth: Jul 03, 1995 Chief Complaint:   Annual Exam  History of Present Illness:   Michelle Stephenson is a 28 y.o. [id number] female being seen today for a routine well-woman exam.   Nexplanon : Notes considerable irregular bleeding- menses last for 20 days.  May stop for a few days then bleeding will return.  Menses will vary and at time moderate to heavy flow.  Likely having anemia- will take iron and feel somewhat better.  She wishes to review her options and does not want another pregnancy in the future.  She was initially considering sterilization.  Upon further questioning, she notes that her partner has had a vasectomy and follow-up semen analysis confirms successful procedure  She denies irregular discharge, itching or irritation.  Denies pelvic or abdominal pain.  Her only issue is trying to fix her irregular menses  Patient's last menstrual period was 10/30/2023 (exact date).  The current method of family planning is vasectomy.    Last pap 2022.  Last mammogram: NA. Last colonoscopy: NA     10/07/2023    2:25 PM 02/03/2021   11:25 AM 07/27/2017   11:11 AM  Depression screen PHQ 2/9  Decreased Interest 3 1 3   Down, Depressed, Hopeless 0 1 3  PHQ - 2 Score 3 2 6   Altered sleeping 0 3 3  Tired, decreased energy 0 1 0  Change in appetite 2 1 3   Feeling bad or failure about yourself  0 2 0  Trouble concentrating 0 1 0  Moving slowly or fidgety/restless  1 0  Suicidal thoughts 0 0 0  PHQ-9 Score 5 11 12   Difficult doing work/chores   Somewhat difficult      Review of Systems:   Pertinent items are noted in HPI Denies any headaches, blurred vision, fatigue, shortness of breath, chest pain, abdominal pain, bowel movements, urination, or intercourse unless otherwise stated above.  Pertinent History Reviewed:  Reviewed past medical,surgical, social and family history.  Reviewed problem list,  medications and allergies. Physical Assessment:   Vitals:   11/04/23 1342  BP: 101/70  Pulse: 84  Weight: 218 lb (98.9 kg)  Height: 5' 2 (1.575 m)  Body mass index is 39.87 kg/m.        Physical Examination:   General appearance - well appearing, and in no distress  Mental status - alert, oriented to person, place, and time  Psych:  She has a normal mood and affect  Skin - warm and dry, normal color, no suspicious lesions noted  Chest - effort normal, all lung fields clear to auscultation bilaterally  Heart - normal rate and regular rhythm  Neck:  midline trachea, no thyromegaly or nodules  Breasts - breasts appear normal, no suspicious masses, no skin or nipple changes or  axillary nodes  Abdomen - soft, nontender, nondistended, no masses or organomegaly  Pelvic - VULVA: normal appearing vulva with no masses, tenderness or lesions  VAGINA: normal appearing vagina with normal color and discharge, no lesions  CERVIX: normal appearing cervix without discharge or lesions, no CMT  Thin prep pap is done with HR HPV cotesting  UTERUS: uterus is felt to be normal size, shape, consistency and nontender   ADNEXA: No adnexal masses or tenderness noted.  Extremities:  No swelling or varicosities noted.  Left arm with palpable device  Chaperone: Aleck Blase     NEXPLANON  REMOVAL    Risks/benefits/side effects of Nexplanon   have been discussed and her questions have been answered.  Specifically, a failure rate of 02/998 has been reported, with an increased failure rate if pt takes St. John's Wort and/or antiseizure medicaitons.  She is aware of the common side effect of irregular bleeding, which the incidence of decreases over time. Signed copy of informed consent in chart.    Nexplanon  site identified.  Area prepped in usual sterile fashon. Two cc's of 2% lidocaine  was used to anesthetize the area. A small stab incision was made right beside the implant on the distal portion.  The  Nexplanon  rod was grasped using hemostats and removed intact without difficulty.  Steri-strips and a pressure bandage was applied.  There was less than 3 cc blood loss. There were no complications.  The patient tolerated the procedure well.  Assessment & Plan:  1) Well-Woman Exam - Pap collected, reviewed ASCCP guidelines  2) Contraceptive management - Reassured patient that female sterilization should be an adequate form of contraception - Plan to remove Nexplanon  and monitor bleeding.  Should she note heavy or irregular menses, RTC  3) Nexplanon  removal - Device removed without complications, see above  No orders of the defined types were placed in this encounter.   Meds: No orders of the defined types were placed in this encounter.   Follow-up: Return in about 1 year (around 11/03/2024) for Annual.   Rielyn Krupinski, DO Attending Obstetrician & Gynecologist, [facility], [facility]

## 2023-11-08 ENCOUNTER — Telehealth: Payer: Self-pay | Admitting: Family Medicine

## 2023-11-08 DIAGNOSIS — Z6841 Body Mass Index (BMI) 40.0 and over, adult: Secondary | ICD-10-CM

## 2023-11-08 DIAGNOSIS — R7301 Impaired fasting glucose: Secondary | ICD-10-CM

## 2023-11-08 MED ORDER — WEGOVY 0.5 MG/0.5ML ~~LOC~~ SOAJ: 0.5000 mg | SUBCUTANEOUS | 1 refills | Status: DC

## 2023-11-08 NOTE — Telephone Encounter (Unsigned)
 Copied from CRM #[id number]. Topic: Clinical - Medication Refill >> Nov 08, 2023  9:02 AM Tanazia G wrote: Medication: semaglutide -weight management (WEGOVY ) 0.25 MG/0.5ML SOAJ SQ injection Patient requesting next up dosage  Has the patient contacted their pharmacy? Yes (Agent: If no, request that the patient contact the pharmacy for the refill. If patient does not wish to contact the pharmacy document the reason why and proceed with request.) (Agent: If yes, when and what did the pharmacy advise?)  This is the patient's preferred pharmacy:  [id number] - [place] ([place]), [address] - [address] [place] ([place]) [place] [address] Phone: [phone] Fax: [phone]  Is this the correct pharmacy for this prescription? Yes If no, delete pharmacy and type the correct one.   Has the prescription been filled recently? Yes  Is the patient out of the medication? Yes  Has the patient been seen for an appointment in the last year OR does the patient have an upcoming appointment? Yes  Can we respond through MyChart? Yes  Agent: Please be advised that Rx refills may take up to 3 business days. We ask that you follow-up with your pharmacy.

## 2023-11-08 NOTE — Telephone Encounter (Signed)
 Patient is informed, no further questions.

## 2023-11-08 NOTE — Telephone Encounter (Signed)
 Medication: semaglutide -weight management (WEGOVY ) 0.25 MG/0.5ML SOAJ SQ injection Patient requesting next up dosage

## 2023-11-09 ENCOUNTER — Ambulatory Visit: Payer: Self-pay | Admitting: Obstetrics & Gynecology

## 2023-11-09 LAB — CYTOLOGY - PAP
Adequacy: ABSENT
Comment: NEGATIVE
Diagnosis: NEGATIVE
High risk HPV: NEGATIVE

## 2023-11-25 ENCOUNTER — Ambulatory Visit (INDEPENDENT_AMBULATORY_CARE_PROVIDER_SITE_OTHER): Admitting: Family Medicine

## 2023-11-25 ENCOUNTER — Encounter: Payer: Self-pay | Admitting: Family Medicine

## 2023-11-25 VITALS — BP 124/80 | HR 65 | Wt 212.6 lb

## 2023-11-25 DIAGNOSIS — Z6841 Body Mass Index (BMI) 40.0 and over, adult: Secondary | ICD-10-CM | POA: Diagnosis not present

## 2023-11-25 DIAGNOSIS — E66813 Obesity, class 3: Secondary | ICD-10-CM

## 2023-11-25 NOTE — Progress Notes (Signed)
   Name: Michelle Stephenson   Date of Visit: 11/25/23   Date of last visit with me: 11/08/2023   CHIEF COMPLAINT:  Chief Complaint  Patient presents with   Follow-up    4 week follow up on toe and GLP. Stop taking Wegovy , made her very sick and her insurance stopped covering.       HPI:  Discussed the use of AI scribe software for clinical note transcription with the patient, who gave verbal consent to proceed.  History of Present Illness   Michelle Stephenson is a 28 year old female who presents for weight management follow-up.  She has experienced a weight loss of six pounds over the past month and a half, attributed to dietary changes and a three-week period of vomiting. She wasusing Wegovy , which has increased her awareness of eating habits but has stopped. She is more conscious of her food intake and is working on adopting healthier eating practices.  She has increased her consumption of fruits and vegetables, finding them satisfying and less likely to contribute to weight gain. She uses a food scale to monitor portion sizes and attempts to count calories, though she is uncertain about the accuracy due to discrepancies between scanned items and their labels.  She no longer uses any form of birth control, having had her explant removed, and does not wish to be on any birth control at this time.  She recalls a past thyroid issue during college, which she previously used as a reason for her weight challenges, but now acknowledges that her minimal exercise and dietary habits were more significant factors.         OBJECTIVE:       10/07/2023    2:25 PM  Depression screen PHQ 2/9  Decreased Interest 3  Down, Depressed, Hopeless 0  PHQ - 2 Score 3  Altered sleeping 0  Tired, decreased energy 0  Change in appetite 2  Feeling bad or failure about yourself  0  Trouble concentrating 0  Suicidal thoughts 0  PHQ-9 Score 5     BP Readings from Last 3 Encounters:  11/25/23 124/80   11/04/23 101/70  10/18/23 124/78    BP 124/80   Pulse 65   Wt 212 lb 9.6 oz (96.4 kg)   LMP 10/30/2023 (Exact Date)   SpO2 99%   BMI 38.89 kg/m    Physical Exam          Physical Exam Constitutional:      Appearance: Normal appearance.  Neurological:     General: No focal deficit present.     Mental Status: She is alert and oriented to person, place, and time. Mental status is at baseline.     ASSESSMENT/PLAN:   Assessment & Plan Class 3 severe obesity due to excess calories without serious comorbidity with body mass index (BMI) of 40.0 to 44.9 in adult [facility])    Assessment and Plan    Obesity, class 3 Class 3 obesity with recent 6-pound weight loss. Vomiting may have contributed. Discussed dietary changes and potential use of stimulant medications for appetite suppression. Emphasized long-term healthy eating habits. - Continue dietary modifications and weight loss efforts. - Consider non-injectable stimulant medications for appetite suppression if needed. - Follow up in four months to reassess weight management progress.         Roshawnda Pecora A. Vita MD [facility]

## 2024-03-01 ENCOUNTER — Ambulatory Visit: Payer: Self-pay | Admitting: Family Medicine

## 2024-03-02 ENCOUNTER — Encounter: Payer: Self-pay | Admitting: Family Medicine

## 2024-03-20 ENCOUNTER — Other Ambulatory Visit (HOSPITAL_COMMUNITY): Payer: Self-pay

## 2024-03-27 ENCOUNTER — Other Ambulatory Visit (HOSPITAL_COMMUNITY): Payer: Self-pay

## 2024-03-28 ENCOUNTER — Other Ambulatory Visit (HOSPITAL_COMMUNITY): Payer: Self-pay

## 2024-03-28 ENCOUNTER — Telehealth (HOSPITAL_COMMUNITY): Payer: Self-pay | Admitting: Pharmacy Technician

## 2024-04-12 ENCOUNTER — Encounter: Payer: Self-pay | Admitting: Family Medicine

## 2024-05-01 ENCOUNTER — Ambulatory Visit: Admitting: Family Medicine
